# Patient Record
Sex: Female | Born: 1955
Health system: Southern US, Community
[De-identification: ages and names within clinical notes are randomized; demographics above are authoritative.]

## PROBLEM LIST (undated history)

## (undated) DIAGNOSIS — F32A Depression, unspecified: Secondary | ICD-10-CM

## (undated) DIAGNOSIS — Z789 Other specified health status: Secondary | ICD-10-CM

## (undated) HISTORY — PX: DILATION AND CURETTAGE OF UTERUS: SHX78

---

## 1998-12-02 ENCOUNTER — Other Ambulatory Visit: Admission: RE | Admit: 1998-12-02 | Discharge: 1998-12-02 | Payer: Self-pay | Admitting: Obstetrics and Gynecology

## 1999-01-17 ENCOUNTER — Other Ambulatory Visit: Admission: RE | Admit: 1999-01-17 | Discharge: 1999-01-17 | Payer: Self-pay | Admitting: Obstetrics and Gynecology

## 2001-03-12 ENCOUNTER — Other Ambulatory Visit: Admission: RE | Admit: 2001-03-12 | Discharge: 2001-03-12 | Payer: Self-pay | Admitting: Obstetrics and Gynecology

## 2002-05-06 ENCOUNTER — Other Ambulatory Visit: Admission: RE | Admit: 2002-05-06 | Discharge: 2002-05-06 | Payer: Self-pay | Admitting: Obstetrics and Gynecology

## 2003-05-25 ENCOUNTER — Other Ambulatory Visit: Admission: RE | Admit: 2003-05-25 | Discharge: 2003-05-25 | Payer: Self-pay | Admitting: Obstetrics and Gynecology

## 2004-09-28 ENCOUNTER — Other Ambulatory Visit: Admission: RE | Admit: 2004-09-28 | Discharge: 2004-09-28 | Payer: Self-pay | Admitting: Obstetrics and Gynecology

## 2005-12-07 ENCOUNTER — Other Ambulatory Visit: Admission: RE | Admit: 2005-12-07 | Discharge: 2005-12-07 | Payer: Self-pay | Admitting: Obstetrics and Gynecology

## 2007-05-27 ENCOUNTER — Encounter: Admission: RE | Admit: 2007-05-27 | Discharge: 2007-05-27 | Payer: Self-pay | Admitting: Obstetrics and Gynecology

## 2008-10-05 ENCOUNTER — Encounter: Admission: RE | Admit: 2008-10-05 | Discharge: 2008-10-05 | Payer: Self-pay | Admitting: Obstetrics and Gynecology

## 2009-10-19 ENCOUNTER — Encounter: Admission: RE | Admit: 2009-10-19 | Discharge: 2009-10-19 | Payer: Self-pay | Admitting: Obstetrics and Gynecology

## 2010-09-10 ENCOUNTER — Encounter: Payer: Self-pay | Admitting: Obstetrics and Gynecology

## 2010-11-14 ENCOUNTER — Other Ambulatory Visit: Payer: Self-pay | Admitting: Obstetrics and Gynecology

## 2010-11-14 DIAGNOSIS — Z1231 Encounter for screening mammogram for malignant neoplasm of breast: Secondary | ICD-10-CM

## 2010-11-22 ENCOUNTER — Ambulatory Visit: Payer: Self-pay

## 2010-11-22 ENCOUNTER — Ambulatory Visit
Admission: RE | Admit: 2010-11-22 | Discharge: 2010-11-22 | Disposition: A | Discharge status: Home / Self Care | Payer: 59 | Source: Ambulatory Visit | Attending: Obstetrics and Gynecology | Admitting: Obstetrics and Gynecology

## 2010-11-22 DIAGNOSIS — Z1231 Encounter for screening mammogram for malignant neoplasm of breast: Secondary | ICD-10-CM

## 2010-11-24 ENCOUNTER — Other Ambulatory Visit: Payer: Self-pay | Admitting: Obstetrics and Gynecology

## 2010-11-24 DIAGNOSIS — R928 Other abnormal and inconclusive findings on diagnostic imaging of breast: Secondary | ICD-10-CM

## 2010-11-29 ENCOUNTER — Ambulatory Visit
Admission: RE | Admit: 2010-11-29 | Discharge: 2010-11-29 | Disposition: A | Discharge status: Home / Self Care | Payer: 59 | Source: Ambulatory Visit | Attending: Obstetrics and Gynecology | Admitting: Obstetrics and Gynecology

## 2010-11-29 DIAGNOSIS — R928 Other abnormal and inconclusive findings on diagnostic imaging of breast: Secondary | ICD-10-CM

## 2011-05-15 ENCOUNTER — Encounter (HOSPITAL_COMMUNITY): Payer: Self-pay

## 2011-05-15 ENCOUNTER — Encounter (HOSPITAL_COMMUNITY)
Admission: RE | Admit: 2011-05-15 | Discharge: 2011-05-15 | Disposition: A | Discharge status: Home / Self Care | Payer: 59 | Source: Ambulatory Visit | Attending: Obstetrics and Gynecology | Admitting: Obstetrics and Gynecology

## 2011-05-15 HISTORY — DX: Other specified health status: Z78.9

## 2011-05-15 LAB — CBC
HCT: 39.1 % (ref 36.0–46.0)
Hemoglobin: 12.7 g/dL (ref 12.0–15.0)
MCH: 29.5 pg (ref 26.0–34.0)
MCV: 90.9 fL (ref 78.0–100.0)
RBC: 4.3 MIL/uL (ref 3.87–5.11)

## 2011-05-15 NOTE — Patient Instructions (Signed)
   Your procedure is scheduled on:05/22/11- Tuesday  Enter through the Main Entrance of [facility] at:6am Pick up the phone at the desk and dial [phone] and inform us of your arrival  Please call this number if you have any problems the morning of surgery: [phone]  Remember: Do not eat food after midnight  Do not drink clear liquids after:midnight Take these medicines the morning of surgery with a SIP OF WATER:none  Do not wear jewelry, make-up, or FINGER nail polish Do not wear lotions, powders, or perfumes.  You may wear deodorant. Do not shave 48 hours prior to surgery. Do not bring valuables to the hospital. Leave suitcase in the car. After Surgery it may be brought to your room. For patients being admitted to the hospital, checkout time is 11:00am the day of discharge.  Patients discharged on the day of surgery will not be allowed to drive home.   Name and phone number of your driver: [phone]- [phone]   Remember to use your hibiclens as instructed.Please shower with 1/2 bottle the evening before your surgery and the other 1/2 bottle the morning of surgery.

## 2011-05-18 NOTE — H&P (Signed)
NAME:  Jasmine Kelly, Jasmine Kelly NO.:  [id number]  MEDICAL RECORD NO.:  [id number]  LOCATION:                                 FACILITY:  PHYSICIAN:  Juluis Mire, M.D.   DATE OF BIRTH:  September 14, 1955  DATE OF ADMISSION: DATE OF DISCHARGE:                             HISTORY & PHYSICAL   The patient is a 55 year old, gravida 3, para 2, abortus 1, postmenopausal patient who presents for laparoscopic-assisted vaginal hysterectomy with bilateral salpingo-oophorectomy, anterior and posterior repair.  Mid urethral sling and possible sacrospinous ligament suspension.  In relation to present admission, the patient has had worsening pelvic relaxation in the form of moderate to severe cystocele and associated uterine descensus.  This has been associated with stress urinary incontinence.  She underwent urodynamic testing in the office, which did reveal stress incontinence due to urethral hypermobility.  She had normal leak point pressures.  She had normal urethral pressure profiles. There was no obstructive voiding pattern and she had no evidence of overactive bladder activity.  Her postvoid residual was also normal.  We had discussed with her options including use of pessaries or just conservative followup.  Presently, she wished to proceed with the surgery as noted above.  In terms of allergies, she has no known drug allergies.  MEDICATIONS:  Lexapro 20 mg a day, magnesium supplementation, and Omega vitamin.  PAST MEDICAL HISTORY:  She has had usual childhood diseases.  No significant sequelae.  PAST SURGICAL HISTORY:  She has had foot surgery and a previous D and C. She has also had two vaginal deliveries.  SOCIAL HISTORY:  No tobacco or alcohol use.  FAMILY HISTORY:  Noncontributory.  REVIEW OF SYSTEMS:  Noncontributory.  PHYSICAL EXAMINATION:  VITAL SIGNS:  The patient is afebrile, stable vital signs. HEENT:  The patient is normocephalic.  Pupils are equal, round,  and reactive to light and accommodation.  Extraocular movements are intact. Sclerae and conjunctivae are clear.  Oropharynx clear. NECK:  Without thyromegaly. BREASTS:  Not examined. LUNGS:  Clear. CARDIAC:  Regular rhythm and rate.  There are no murmurs or gallops. ABDOMEN:  Benign.  No mass, organomegaly, or tenderness. PELVIC:  Normal external genitalia.  Vaginal mucosa is somewhat atrophic.  She has descent of the cervix to the introitus associated moderate severe cystocele, mild rectocele.  Uterus normal size and shape.  Adnexa unremarkable. EXTREMITIES:  Trace edema. NEUROLOGIC:  Grossly normal within limits.  IMPRESSION:  Symptomatic pelvic relaxation with associated stress incontinence.  PLAN:  The patient will undergo the above-noted surgery.  Potential risk of recurrent pelvic relaxation has been discussed.  Risks of surgery have been explained including the risk of infection.  The risk of hemorrhage that could require transfusion with the risk of AIDS or hepatitis.  Risk of injury to adjacent organs including bladder, bowel, or ureters that could require further exploratory surgery.  Risk of deep venous thrombosis and pulmonary embolus.  There is the risk of injury to either the obturator nerve or possibly the pudendal nerve.  This can lead to chronic leg pain and weakness and possible buttocks pain and numbness or perineal pain and numbness.  With the mesh  system, there is the rare risk of mesh erosion.  This can require further surgical management.  Also, there is the risk of mesh rejection.  Overtightening the sling can lead to an obstructive voiding pattern requiring loosening the string with the possible return of incontinence.  There is the risk of increasing bladder contractility that can lead to leak and requiring medications for overactive bladder.  The patient expressed understanding potential risks, complications, and indications.     Juluis Mire,  M.D.     JSM/MEDQ  D:  05/18/2011  T:  05/18/2011  Job:  [id number]

## 2011-05-18 NOTE — H&P (Signed)
  Patient name Jasmine Kelly DICTATION# [id number] CSN#  [id number]  Juluis Mire, MD 05/18/2011 9:17 AM

## 2011-05-21 MED ORDER — CEFAZOLIN SODIUM-DEXTROSE 2-3 GM-% IV SOLR
2.0000 g | INTRAVENOUS | Status: AC
  Administered 2011-05-22: 2 g via INTRAVENOUS
  Filled 2011-05-21: qty 50

## 2011-05-22 ENCOUNTER — Encounter (HOSPITAL_COMMUNITY): Payer: Self-pay | Admitting: Anesthesiology

## 2011-05-22 ENCOUNTER — Encounter (HOSPITAL_COMMUNITY)
Admission: RE | Disposition: A | Discharge status: Home Health | Payer: Self-pay | Source: Ambulatory Visit | Attending: Obstetrics and Gynecology

## 2011-05-22 ENCOUNTER — Inpatient Hospital Stay (HOSPITAL_COMMUNITY): Payer: 59 | Admitting: Anesthesiology

## 2011-05-22 ENCOUNTER — Ambulatory Visit (HOSPITAL_COMMUNITY)
Admission: RE | Admit: 2011-05-22 | Discharge: 2011-05-23 | Disposition: A | Discharge status: Home Health | Payer: 59 | Source: Ambulatory Visit | Attending: Obstetrics and Gynecology | Admitting: Obstetrics and Gynecology

## 2011-05-22 ENCOUNTER — Other Ambulatory Visit: Payer: Self-pay | Admitting: Obstetrics and Gynecology

## 2011-05-22 ENCOUNTER — Encounter (HOSPITAL_COMMUNITY): Payer: Self-pay | Admitting: *Deleted

## 2011-05-22 DIAGNOSIS — N812 Incomplete uterovaginal prolapse: Secondary | ICD-10-CM | POA: Insufficient documentation

## 2011-05-22 DIAGNOSIS — N393 Stress incontinence (female) (male): Secondary | ICD-10-CM | POA: Insufficient documentation

## 2011-05-22 DIAGNOSIS — Z01812 Encounter for preprocedural laboratory examination: Secondary | ICD-10-CM | POA: Insufficient documentation

## 2011-05-22 DIAGNOSIS — N8189 Other female genital prolapse: Secondary | ICD-10-CM

## 2011-05-22 DIAGNOSIS — Z01818 Encounter for other preprocedural examination: Secondary | ICD-10-CM | POA: Insufficient documentation

## 2011-05-22 HISTORY — PX: LAPAROSCOPIC ASSISTED VAGINAL HYSTERECTOMY: SHX5398

## 2011-05-22 HISTORY — PX: BLADDER SUSPENSION: SHX72

## 2011-05-22 HISTORY — PX: ANTERIOR AND POSTERIOR REPAIR: SHX5121

## 2011-05-22 HISTORY — PX: CYSTOSCOPY: SHX5120

## 2011-05-22 SURGERY — HYSTERECTOMY, VAGINAL, LAPAROSCOPY-ASSISTED
Anesthesia: General | Site: Vagina | Wound class: Clean Contaminated

## 2011-05-22 MED ORDER — ROCURONIUM BROMIDE 50 MG/5ML IV SOLN
INTRAVENOUS | Status: AC
  Filled 2011-05-22: qty 1

## 2011-05-22 MED ORDER — ACETAMINOPHEN 325 MG PO TABS: 325.0000 mg | ORAL_TABLET | ORAL | Status: DC | PRN

## 2011-05-22 MED ORDER — FENTANYL CITRATE 0.05 MG/ML IJ SOLN
INTRAMUSCULAR | Status: AC
  Filled 2011-05-22: qty 5

## 2011-05-22 MED ORDER — MIDAZOLAM HCL 5 MG/5ML IJ SOLN
INTRAMUSCULAR | Status: DC | PRN
  Administered 2011-05-22: 2 mg via INTRAVENOUS

## 2011-05-22 MED ORDER — HYDROMORPHONE HCL 1 MG/ML IJ SOLN
INTRAMUSCULAR | Status: AC
  Filled 2011-05-22: qty 1

## 2011-05-22 MED ORDER — MIDAZOLAM HCL 2 MG/2ML IJ SOLN
INTRAMUSCULAR | Status: AC
  Filled 2011-05-22: qty 2

## 2011-05-22 MED ORDER — ROCURONIUM BROMIDE 100 MG/10ML IV SOLN
INTRAVENOUS | Status: DC | PRN
  Administered 2011-05-22: 40 mg via INTRAVENOUS

## 2011-05-22 MED ORDER — LACTATED RINGERS IV SOLN
INTRAVENOUS | Status: DC
  Administered 2011-05-22: 09:00:00 via INTRAVENOUS
  Administered 2011-05-22: 1000 mL via INTRAVENOUS
  Administered 2011-05-22: 07:00:00 via INTRAVENOUS

## 2011-05-22 MED ORDER — HYDROMORPHONE HCL 1 MG/ML IJ SOLN
0.2500 mg | INTRAMUSCULAR | Status: DC | PRN
  Administered 2011-05-22 (×2): 0.5 mg via INTRAVENOUS

## 2011-05-22 MED ORDER — INDIGOTINDISULFONATE SODIUM 8 MG/ML IJ SOLN
INTRAMUSCULAR | Status: DC | PRN
  Administered 2011-05-22: 5 mL via INTRAVENOUS

## 2011-05-22 MED ORDER — FENTANYL CITRATE 0.05 MG/ML IJ SOLN
INTRAMUSCULAR | Status: DC | PRN
  Administered 2011-05-22 (×2): 50 ug via INTRAVENOUS
  Administered 2011-05-22: 150 ug via INTRAVENOUS

## 2011-05-22 MED ORDER — HYDROMORPHONE 0.3 MG/ML IV SOLN
INTRAVENOUS | Status: DC
  Administered 2011-05-22: 1.39 mg via INTRAVENOUS
  Administered 2011-05-22: 12:00:00 via INTRAVENOUS
  Administered 2011-05-22: 1.2 mg via INTRAVENOUS
  Administered 2011-05-23: 0.999 mg via INTRAVENOUS
  Administered 2011-05-23: 0.4 mg via INTRAVENOUS

## 2011-05-22 MED ORDER — LIDOCAINE HCL (CARDIAC) 20 MG/ML IV SOLN
INTRAVENOUS | Status: AC
  Filled 2011-05-22: qty 5

## 2011-05-22 MED ORDER — ESCITALOPRAM OXALATE 20 MG PO TABS
20.0000 mg | ORAL_TABLET | Freq: Every day | ORAL | Status: DC
Start: 2011-05-22 — End: 2011-05-23
  Administered 2011-05-23: 20 mg via ORAL
  Filled 2011-05-22 (×2): qty 1

## 2011-05-22 MED ORDER — FENTANYL CITRATE 0.05 MG/ML IJ SOLN
INTRAMUSCULAR | Status: AC
  Administered 2011-05-22: 50 ug via INTRAVENOUS
  Filled 2011-05-22: qty 2

## 2011-05-22 MED ORDER — FENTANYL CITRATE 0.05 MG/ML IJ SOLN
25.0000 ug | INTRAMUSCULAR | Status: DC | PRN
  Administered 2011-05-22 (×2): 50 ug via INTRAVENOUS

## 2011-05-22 MED ORDER — SIMETHICONE 80 MG PO CHEW: 80.0000 mg | CHEWABLE_TABLET | Freq: Four times a day (QID) | ORAL | Status: DC | PRN

## 2011-05-22 MED ORDER — OXYCODONE-ACETAMINOPHEN 5-325 MG PO TABS: 1.0000 | ORAL_TABLET | ORAL | Status: DC | PRN

## 2011-05-22 MED ORDER — DEXAMETHASONE SODIUM PHOSPHATE 10 MG/ML IJ SOLN
INTRAMUSCULAR | Status: DC | PRN
  Administered 2011-05-22: 10 mg via INTRAVENOUS

## 2011-05-22 MED ORDER — PROPOFOL 10 MG/ML IV EMUL
INTRAVENOUS | Status: AC
  Filled 2011-05-22: qty 20

## 2011-05-22 MED ORDER — DOCUSATE SODIUM 100 MG PO CAPS
100.0000 mg | ORAL_CAPSULE | Freq: Every day | ORAL | Status: DC
  Administered 2011-05-23: 100 mg via ORAL
  Filled 2011-05-22: qty 1

## 2011-05-22 MED ORDER — ONDANSETRON HCL 4 MG/2ML IJ SOLN
INTRAMUSCULAR | Status: AC
  Filled 2011-05-22: qty 2

## 2011-05-22 MED ORDER — PROMETHAZINE HCL 25 MG/ML IJ SOLN: 6.2500 mg | INTRAMUSCULAR | Status: DC | PRN

## 2011-05-22 MED ORDER — LIDOCAINE-EPINEPHRINE (PF) 1 %-1:200000 IJ SOLN
INTRAMUSCULAR | Status: DC | PRN
  Administered 2011-05-22: 26 mL

## 2011-05-22 MED ORDER — DIPHENHYDRAMINE HCL 50 MG/ML IJ SOLN: 12.5000 mg | Freq: Four times a day (QID) | INTRAMUSCULAR | Status: DC | PRN

## 2011-05-22 MED ORDER — NEOSTIGMINE METHYLSULFATE 1 MG/ML IJ SOLN
INTRAMUSCULAR | Status: DC | PRN
  Administered 2011-05-22: 2 mg via INTRAVENOUS

## 2011-05-22 MED ORDER — SENNOSIDES-DOCUSATE SODIUM 8.6-50 MG PO TABS: 2.0000 | ORAL_TABLET | Freq: Every day | ORAL | Status: DC | PRN

## 2011-05-22 MED ORDER — CEFAZOLIN SODIUM-DEXTROSE 2-3 GM-% IV SOLR
2.0000 g | INTRAVENOUS | Status: DC
  Filled 2011-05-22: qty 50

## 2011-05-22 MED ORDER — NALOXONE HCL 0.4 MG/ML IJ SOLN: 0.4000 mg | INTRAMUSCULAR | Status: DC | PRN

## 2011-05-22 MED ORDER — ONDANSETRON HCL 4 MG/2ML IJ SOLN: 4.0000 mg | Freq: Four times a day (QID) | INTRAMUSCULAR | Status: DC | PRN

## 2011-05-22 MED ORDER — GLYCOPYRROLATE 0.2 MG/ML IJ SOLN
INTRAMUSCULAR | Status: AC
  Filled 2011-05-22: qty 2

## 2011-05-22 MED ORDER — LIDOCAINE HCL (CARDIAC) 20 MG/ML IV SOLN
INTRAVENOUS | Status: DC | PRN
  Administered 2011-05-22: 60 mg via INTRAVENOUS

## 2011-05-22 MED ORDER — ONDANSETRON HCL 4 MG PO TABS: 4.0000 mg | ORAL_TABLET | Freq: Four times a day (QID) | ORAL | Status: DC | PRN

## 2011-05-22 MED ORDER — KETOROLAC TROMETHAMINE 30 MG/ML IJ SOLN: 15.0000 mg | Freq: Once | INTRAMUSCULAR | Status: DC | PRN

## 2011-05-22 MED ORDER — DEXAMETHASONE SODIUM PHOSPHATE 10 MG/ML IJ SOLN
INTRAMUSCULAR | Status: AC
  Filled 2011-05-22: qty 1

## 2011-05-22 MED ORDER — ONDANSETRON HCL 4 MG/2ML IJ SOLN
INTRAMUSCULAR | Status: DC | PRN
  Administered 2011-05-22: 4 mg via INTRAVENOUS

## 2011-05-22 MED ORDER — INDIGOTINDISULFONATE SODIUM 8 MG/ML IJ SOLN
INTRAMUSCULAR | Status: AC
  Filled 2011-05-22: qty 5

## 2011-05-22 MED ORDER — GLYCOPYRROLATE 0.2 MG/ML IJ SOLN
INTRAMUSCULAR | Status: DC | PRN
  Administered 2011-05-22 (×2): 0.2 mg via INTRAVENOUS
  Administered 2011-05-22: .4 mg via INTRAVENOUS

## 2011-05-22 MED ORDER — HYDROMORPHONE 0.3 MG/ML IV SOLN
INTRAVENOUS | Status: AC
  Filled 2011-05-22: qty 25

## 2011-05-22 MED ORDER — MENTHOL 3 MG MT LOZG: 1.0000 | LOZENGE | OROMUCOSAL | Status: DC | PRN

## 2011-05-22 MED ORDER — BUPIVACAINE HCL (PF) 0.25 % IJ SOLN
INTRAMUSCULAR | Status: DC | PRN
  Administered 2011-05-22: 8 mL

## 2011-05-22 MED ORDER — STERILE WATER FOR IRRIGATION IR SOLN
Status: DC | PRN
  Administered 2011-05-22: 1000 mL via INTRAVESICAL

## 2011-05-22 MED ORDER — ZOLPIDEM TARTRATE 5 MG PO TABS: 5.0000 mg | ORAL_TABLET | Freq: Every evening | ORAL | Status: DC | PRN

## 2011-05-22 MED ORDER — PROPOFOL 10 MG/ML IV EMUL
INTRAVENOUS | Status: DC | PRN
  Administered 2011-05-22: 150 mg via INTRAVENOUS

## 2011-05-22 MED ORDER — HYDROMORPHONE HCL 1 MG/ML IJ SOLN
INTRAMUSCULAR | Status: DC | PRN
  Administered 2011-05-22: 1 mg via INTRAVENOUS

## 2011-05-22 MED ORDER — NEOSTIGMINE METHYLSULFATE 1 MG/ML IJ SOLN
INTRAMUSCULAR | Status: AC
  Filled 2011-05-22: qty 10

## 2011-05-22 MED ORDER — HYDROMORPHONE HCL 1 MG/ML IJ SOLN
INTRAMUSCULAR | Status: AC
  Administered 2011-05-22: 0.5 mg via INTRAVENOUS
  Filled 2011-05-22: qty 1

## 2011-05-22 MED ORDER — LACTATED RINGERS IR SOLN
Status: DC | PRN
  Administered 2011-05-22: 3000 mL

## 2011-05-22 MED ORDER — DIPHENHYDRAMINE HCL 12.5 MG/5ML PO ELIX: 12.5000 mg | ORAL_SOLUTION | Freq: Four times a day (QID) | ORAL | Status: DC | PRN

## 2011-05-22 MED ORDER — SODIUM CHLORIDE 0.9 % IJ SOLN: 9.0000 mL | INTRAMUSCULAR | Status: DC | PRN

## 2011-05-22 SURGICAL SUPPLY — 66 items
ADH SKN CLS APL DERMABOND .7 (GAUZE/BANDAGES/DRESSINGS) ×3
BANDAGE GAUZE ELAST BULKY 4 IN (GAUZE/BANDAGES/DRESSINGS) IMPLANT
BLADE SURG 11 STRL SS (BLADE) ×4 IMPLANT
BLADE SURG 15 STRL LF C SS BP (BLADE) ×3 IMPLANT
BLADE SURG 15 STRL SS (BLADE) ×4
CABLE HIGH FREQUENCY MONO STRZ (ELECTRODE) IMPLANT
CANISTER SUCTION 2500CC (MISCELLANEOUS) ×4 IMPLANT
CATH BONANNO SUPRAPUBIC 14G (CATHETERS) IMPLANT
CATH ROBINSON RED A/P 16FR (CATHETERS) ×4 IMPLANT
CLOTH BEACON ORANGE TIMEOUT ST (SAFETY) ×4 IMPLANT
CONT PATH 16OZ SNAP LID 3702 (MISCELLANEOUS) ×4 IMPLANT
COVER TABLE BACK 60X90 (DRAPES) ×4 IMPLANT
DECANTER SPIKE VIAL GLASS SM (MISCELLANEOUS) ×1 IMPLANT
DERMABOND ADVANCED (GAUZE/BANDAGES/DRESSINGS) ×1
DERMABOND ADVANCED .7 DNX12 (GAUZE/BANDAGES/DRESSINGS) ×3 IMPLANT
DEVICE CAPIO SUTURING (INSTRUMENTS) ×1
DEVICE CAPIO SUTURING OPC (INSTRUMENTS) IMPLANT
DRAPE CAMERA CLOSED 9X96 (DRAPES) ×4 IMPLANT
DRAPE HYSTEROSCOPY (DRAPE) ×3 IMPLANT
DRAPE UTILITY XL STRL (DRAPES) ×3 IMPLANT
ELECT REM PT RETURN 9FT ADLT (ELECTROSURGICAL) ×4
ELECTRODE REM PT RTRN 9FT ADLT (ELECTROSURGICAL) IMPLANT
GAUZE KERLIX 2  STERILE LF (GAUZE/BANDAGES/DRESSINGS) ×1 IMPLANT
GLOVE BIO SURGEON STRL SZ7 (GLOVE) ×12 IMPLANT
GLOVE BIOGEL PI IND STRL 6.5 (GLOVE) ×3 IMPLANT
GLOVE BIOGEL PI INDICATOR 6.5 (GLOVE) ×1
GOWN PREVENTION PLUS LG XLONG (DISPOSABLE) ×12 IMPLANT
GOWN STRL REIN XL XLG (GOWN DISPOSABLE) ×4 IMPLANT
NDL INSUFFLATION 14GA 120MM (NEEDLE) IMPLANT
NEEDLE HYPO 22GX1.5 SAFETY (NEEDLE) ×1 IMPLANT
NEEDLE INSUFFLATION 14GA 120MM (NEEDLE) ×4 IMPLANT
NEEDLE MAYO .5 CIRCLE (NEEDLE) ×1 IMPLANT
NS IRRIG 1000ML POUR BTL (IV SOLUTION) ×4 IMPLANT
PACK LAVH (CUSTOM PROCEDURE TRAY) ×4 IMPLANT
PACK VAGINAL WOMENS (CUSTOM PROCEDURE TRAY) ×3 IMPLANT
SEALER TISSUE G2 CVD JAW 45CM (ENDOMECHANICALS) ×4 IMPLANT
SET CYSTO W/LG BORE CLAMP LF (SET/KITS/TRAYS/PACK) ×4 IMPLANT
SET IRRIG TUBING LAPAROSCOPIC (IRRIGATION / IRRIGATOR) ×1 IMPLANT
SLING HALO OBTRYX (Sling) ×1 IMPLANT
STRIP CLOSURE SKIN 1/4X3 (GAUZE/BANDAGES/DRESSINGS) IMPLANT
SUT CAPIO POLYGLYCOLIC (SUTURE) ×1 IMPLANT
SUT CHROMIC 2 0 UR 5 27 (SUTURE) IMPLANT
SUT MON AB 2-0 CT1 27 (SUTURE) ×4 IMPLANT
SUT MON AB 3-0 SH 27 (SUTURE)
SUT MON AB 3-0 SH27 (SUTURE) IMPLANT
SUT VIC AB 0 CT1 18XCR BRD8 (SUTURE) ×9 IMPLANT
SUT VIC AB 0 CT1 27 (SUTURE)
SUT VIC AB 0 CT1 27XBRD ANBCTR (SUTURE) IMPLANT
SUT VIC AB 0 CT1 36 (SUTURE) ×7 IMPLANT
SUT VIC AB 0 CT1 8-18 (SUTURE) ×12
SUT VIC AB 2-0 CT1 27 (SUTURE) ×24
SUT VIC AB 2-0 CT1 TAPERPNT 27 (SUTURE) ×24 IMPLANT
SUT VIC AB 2-0 CT2 27 (SUTURE) IMPLANT
SUT VIC AB 2-0 SH 27 (SUTURE)
SUT VIC AB 2-0 SH 27XBRD (SUTURE) IMPLANT
SUT VIC AB 2-0 UR6 27 (SUTURE) ×8 IMPLANT
SUT VICRYL 0 UR6 27IN ABS (SUTURE) ×7 IMPLANT
SUT VICRYL 1 TIES 12X18 (SUTURE) ×4 IMPLANT
SUT VICRYL 4-0 PS2 18IN ABS (SUTURE) ×7 IMPLANT
TOWEL OR 17X24 6PK STRL BLUE (TOWEL DISPOSABLE) ×8 IMPLANT
TRAY FOLEY CATH 14FR (SET/KITS/TRAYS/PACK) ×4 IMPLANT
TROCAR BALLN 12MMX100 BLUNT (TROCAR) ×3 IMPLANT
TROCAR XCEL NON-BLD 5MMX100MML (ENDOMECHANICALS) ×1 IMPLANT
TROCAR Z-THREAD BLADED 11X100M (TROCAR) ×1 IMPLANT
WARMER LAPAROSCOPE (MISCELLANEOUS) ×4 IMPLANT
WATER STERILE IRR 1000ML POUR (IV SOLUTION) ×3 IMPLANT

## 2011-05-22 NOTE — Anesthesia Postprocedure Evaluation (Signed)
  Anesthesia Post-op Note  Patient: Jasmine Kelly  Procedure(s) Performed:  LAPAROSCOPIC ASSISTED VAGINAL HYSTERECTOMY - bilateral salpingo-oophorectomy; ANTERIOR (CYSTOCELE) AND POSTERIOR REPAIR (RECTOCELE) - Sacrospinous ligament suspension; TRANSVAGINAL TAPE (TVT) PROCEDURE - Mid urethral sling; CYSTOSCOPY  Patient Location: [facility]  Anesthesia Type: General  Level of Consciousness: awake, alert , oriented and patient cooperative  Airway and Oxygen Therapy: Patient Spontanous Breathing  Post-op Pain: none  Post-op Assessment: Post-op Vital signs reviewed, Patient's Cardiovascular Status Stable, Respiratory Function Stable, Patent Airway, No signs of Nausea or vomiting, Adequate PO intake and Pain level controlled  Post-op Vital Signs: Reviewed and stable  Complications: No apparent anesthesia complications

## 2011-05-22 NOTE — Transfer of Care (Signed)
Immediate Anesthesia Transfer of Care Note  Patient: Jasmine Kelly  Procedure(s) Performed:  LAPAROSCOPIC ASSISTED VAGINAL HYSTERECTOMY - bilateral salpingo-oophorectomy; ANTERIOR (CYSTOCELE) AND POSTERIOR REPAIR (RECTOCELE) - Sacrospinous ligament suspension; TRANSVAGINAL TAPE (TVT) PROCEDURE - Mid urethral sling; CYSTOSCOPY  Patient Location: PACU  Anesthesia Type: General  Level of Consciousness: alert  and sedated  Airway & Oxygen Therapy: Patient connected to nasal cannula oxygen  Post-op Assessment: Report given to PACU RN and Post -op Vital signs reviewed and stable  Post vital signs: stable  Complications: No apparent anesthesia complications

## 2011-05-22 NOTE — Brief Op Note (Signed)
05/22/2011  9:38 AM  PATIENT:  Shepard General  55 y.o. female  PRE-OPERATIVE DIAGNOSIS:  pelvic relaxation, stress urinary incontinence  POST-OPERATIVE DIAGNOSIS:  pelvic relaxation, stress urinary incontinence  PROCEDURE:  Procedure(s): LAPAROSCOPIC ASSISTED VAGINAL HYSTERECTOMY ANTERIOR (CYSTOCELE) AND POSTERIOR REPAIR (RECTOCELE) TRANSVAGINAL TAPE (TVT) PROCEDURE CYSTOSCOPY  SURGEON:  Surgeon(s): Amelianna Meller S Tristyn Pharris Roselle Locus II  PHYSICIAN ASSISTANT:   ASSISTANTS: tomblin   ANESTHESIA:   general  OR FLUID I/O:  Total I/O In: 2300 [I.V.:2300] Out: 450 [Urine:200; Blood:250]  BLOOD ADMINISTERED:none  DRAINS: Urinary Catheter (Foley)   LOCAL MEDICATIONS USED:  XYLOCAINE20*CC  SPECIMEN:  Source of Specimen:  uterus tubes and ovaries  DISPOSITION OF SPECIMEN:  PATHOLOGY  COUNTS:  YES  TOURNIQUET:  * No tourniquets in log *  DICTATION: .Other Dictation: Dictation Number [id number]  PLAN OF CARE: Admit for overnight observation  PATIENT DISPOSITION:  PACU - hemodynamically stable.   Delay start of Pharmacological VTE agent (>24hrs) due to surgical blood loss or risk of bleeding:  no

## 2011-05-22 NOTE — Progress Notes (Signed)
Encounter addended by: Suella Grove on: 05/22/2011  5:37 PM<BR>     Documentation filed: Notes Section

## 2011-05-22 NOTE — H&P (Signed)
Hand P unchanged

## 2011-05-22 NOTE — Op Note (Signed)
Jasmine Kelly, Jasmine Kelly NO.:  [id number]  MEDICAL RECORD NO.:  [id number]  LOCATION:  9315                          FACILITY:  [facility]  PHYSICIAN:  Juluis Mire, M.D.   DATE OF BIRTH:  May 24, 1956  DATE OF PROCEDURE:  05/22/2011 DATE OF DISCHARGE:                              OPERATIVE REPORT   PREOPERATIVE DIAGNOSIS:  Pelvic relaxation with associated stress urinary incontinence.  POSTOPERATIVE DIAGNOSIS:  Pelvic relaxation with associated stress urinary incontinence.  OPERATIVE PROCEDURES:  Laparoscopic-assisted vaginal hysterectomy with bilateral salpingo-oophorectomy.  Anterior colporrhaphy.  Midurethral sling using a transobturator approach.  Cystoscopy.  Posterior colporrhaphy.  Sacrospinous ligament suspension.  SURGEON:  Juluis Mire, MD  ASSISTANT:  Guy Sandifer. Tomblin, MD  ANESTHESIA:  General endotracheal.  ESTIMATED BLOOD LOSS:  3-400 mL.  PACKS:  Vaginal pack.  DRAINS:  Urethral Foley.  INTRAOPERATIVE BLOOD REPLACEMENT:  None.  COMPLICATIONS:  None.  INDICATIONS:  As dictated in the history and physical.  PROCEDURE:  The patient was taken to OR and placed supine position. After satisfactory level of general endotracheal anesthesia was obtained, the patient was placed in dorsal lithotomy position using Allen stirrups.  The abdomen, perineum, and vagina were prepped out with Betadine.  Hulka tenaculum was put in place.  Bladder was emptied via in- and-out catheterization.  The patient was then draped sterile field. Subumbilical incision was made with knife.  A Veress needle was introduced into the abdominal cavity.  The abdomen was insufflated with approximately 3 liters of carbon dioxide.  The operating laparoscope was introduced.  Visualization revealed no evidence of injury to adjacent organs.  The upper abdomen including liver tip of the gallbladder was clear.  Both lateral gutters including appendix were normal.  Uterus, tubes, and  ovaries were unremarkable.  A 5-mm trocar was put in place in the suprapubic area under direct visualization.  The right ovary was elevated.  We could easily see the ureter coursing along the right pelvic sidewall using the Enseal.  The right ovarian pedicle was cauterized and incised.  The mesenteric attachments of tubes and ovaries were cauterized and incised up to the round ligament.  The round ligament was then cauterized and incised.  We then went to the left side.  Left ovary was elevated.  Again, the ureter was easily identified along the left pelvic sidewall.  Using Enseal, the left ovarian vasculature was cauterized and incised.  Mesenteric attachments of tube and ovary were cauterized and incised and the left round ligament was cauterized and incised.  At this point in time, we had good freeing up to the adnexa.  Laparoscope was then removed.  The abdomen was deflated with carbon dioxide.  Legs were repositioned.  The Hulka tenaculum was then removed. Weighted speculum was placed in the vaginal vault.  Cervix was grasped with Christella Hartigan tenaculum.  Cul-de-sac was entered sharply.  Both uterosacral ligaments were clamped, cut, and suture ligated with 0 Vicryl.  Reflection of the vaginal mucosa anteriorly was incised and bladder was dissected superiorly.  Paracervical tissue was clamped, cut, and suture ligated with 0 Vicryl.  The vesicouterine space was identified, entered sharply and retractors put in place.  Using the clamp, cut, and tie technique with suture ligatures of 0 Vicryl, the parametrium was serially separated and incised the uterus.  Uterus was then flipped.  Remaining pedicles were clamped and cut.  Uterus, tubes, and ovaries were passed off the operative field and sent to Pathology, held pedicles and secured with free tie of 0 Vicryl.  Posterior vaginal cuff was run with a running locking sutures of 2-0 Vicryl.  Uterosacral plication stitch of 0 Vicryl was put in  place and secured.  Posterior aspect of vaginal cuff was closed with interrupted figure-of-eights of 2- 0 Vicryl.  At this point in time, we went to the cystocele.  We grasped the edge of the vaginal cuff anteriorly.  We were able to dissect anteriorly under the vaginal mucosa.  We then incised the vaginal mucosa in the midline. We dissected off the pubocervical fascia from the overlying vaginal mucosa.  She really did not have much of the cystocele.  It looked like most of her problems from apical support.  However, we did reapproximate the pubocervical fascia in the midline with interrupted sutures of 2-0 Vicryl obliterating the cystocele.  Vaginal mucosa was then trimmed and the vaginal mucosa was closed with interrupted sutures of 2-0 Vicryl. We also closed the remaining part of the anterior part of the vaginal cuff.  Next, the suburethral area was identified.  A Foley was placed into the urethra and clamped off.  Midurethral area was identified and infiltrated with 1% Xylocaine with epinephrine.  We also infiltrated out laterally on each side.  Vaginal mucosa was then incised vertically.  We dissected out laterally on both sides.  Using sharp and blunt dissection, we were able to dissect out to the obturator foramen.  Two areas on the groin were identified below the abductus longus muscle at the level of the clitoris and lateral to the inferior pubic ramus.  The ovaries were infiltrated with 1% Xylocaine and an incision was made in the skin.  Using OptiRex halo system, both needles were passed through the skin through the obturator foramen around the inferior pubic ramus and out to the vaginal mucosa on each side.  There was no evidence that we had buttonholed the vaginal mucosa.  At this point in time, the Foley was removed.  Cystoscopy was performed.  There was no evidence of injury to the bladder or urethra.  The patient had been given indigo carmine. Jets of blue-tinged urine  was noted to be coming from both ureteral orifices.  Cystoscope was then removed.  The bladder was drained.  The polypropylene mesh was brought in place, secured to each needle and brought out through the skin incisions.  We adjusted the mesh under the urethra to lie flat, but we could rotate a Kelly 90 degrees.  The plastic tip was excised.  The plastic sheaths were removed.  Again, adjusting it under the urethra, we could easily get a Tresa Endo between it and rotated it, but it did lie flat.  The arms of the mesh were trimmed at the level of the skin and the skin was closed with Dermabond.  The vaginal mucosa was closed with a running locking suture of 2-0 Vicryl.  At this point in time, we returned to the posterior repair.  The skin incision was made in the skin of the perineal body and excised up to the vaginal opening.  This was excised.  We infiltrated the vaginal mucosa with 1% Xylocaine with epinephrine.  We then began dissecting  the vaginal mucosa from the underlying perirectal fascia.  This was continued up to the vaginal cuff using both blunt and sharp dissection. We then dissected out to the right side to the sacrospinous ligament. Using the Capio with an absorbable suture of 0 Vicryl, we secured the Vicryl to the sacrospinous ligament near the sacrum.  This was held initially.  At this point in time, we got a free needle and secured the sacrospinous ligament, sutured to the top of the vaginal cuff to the right.  This was held.  We then brought together the perirectal fascia in the midline with interrupted sutures of 2-0 Vicryl completely obliterating the rectocele.  We then tied down the sacrospinous ligament suspension with good elevation of the vaginal cuff.  The vaginal mucosa was then closed with interrupted sutures of 2-0 Vicryl until we got to the vaginal introitus.  Perineum was rebuilt with 2-0 Vicryl.  Skin was closed with running subcuticular of 2-0 Vicryl.  We had  good reapproximation.  Rectal exam was unremarkable.  At this point in time, the abdomen was reinflated with carbon dioxide. Laparoscope was reintroduced.  Visualization revealed actually no active bleeding.  We went to the right ovarian pedicle.  There was a slight bit of oozing and brought under control with bipolar.  Otherwise, we had excellent hemostasis.  We irrigated the pelvis.  No active bleeding was noted.  No injury to the adjacent organs.  The abdomen was deflated with carbon dioxide.  All trocars were removed.  Subumbilical incision was closed with interrupted subcuticular of 4-0 Vicryl.  The suprapubic incision was closed with Dermabond.  A vaginal pack was placed into the vagina.  Foley was straight drained with retrieval of blue-tinged urine.  The patient was taken out of dorsal lithotomy position once alert and extubated, transferred recovery in good condition.  Sponge, instrument, and needle counts were reported as correct by circulating nurse x2.     Juluis Mire, M.D.     JSM/MEDQ  D:  05/22/2011  T:  05/22/2011  Job:  [id number]

## 2011-05-22 NOTE — Anesthesia Preprocedure Evaluation (Signed)
Anesthesia Evaluation  Name, MR# and DOB Patient awake  General Assessment Comment  Reviewed: Allergy & Precautions, H&P , Patient's Chart, lab work & pertinent test results  Airway Mallampati: II TM Distance: >3 FB Neck ROM: full    Dental No notable dental hx.    Pulmonary  clear to auscultation  Pulmonary exam normal       Cardiovascular regular Normal    Neuro/Psych PSYCHIATRIC DISORDERS Negative Neurological ROS  Negative Psych ROS   GI/Hepatic negative GI ROS Neg liver ROS    Endo/Other  Negative Endocrine ROS  Renal/GU negative Renal ROS     Musculoskeletal   Abdominal   Peds  Hematology negative hematology ROS (+)   Anesthesia Other Findings   Reproductive/Obstetrics (+) Pregnancy                           Anesthesia Physical Anesthesia Plan  ASA: II  Anesthesia Plan: General   Post-op Pain Management:    Induction: Intravenous  Airway Management Planned: Oral ETT  Additional Equipment:   Intra-op Plan:   Post-operative Plan:   Informed Consent: I have reviewed the patients History and Physical, chart, labs and discussed the procedure including the risks, benefits and alternatives for the proposed anesthesia with the patient or authorized representative who has indicated his/her understanding and acceptance.     Plan Discussed with:   Anesthesia Plan Comments:         Anesthesia Quick Evaluation

## 2011-05-22 NOTE — Progress Notes (Signed)
  Patient name  Jasmine Kelly DICTATION# [id number] CSN#  [id number]  Juluis Mire, MD 05/22/2011 9:40 AM

## 2011-05-22 NOTE — Anesthesia Postprocedure Evaluation (Signed)
  Anesthesia Post-op Note  Patient: Jasmine Kelly  Procedure(s) Performed:  LAPAROSCOPIC ASSISTED VAGINAL HYSTERECTOMY - bilateral salpingo-oophorectomy; ANTERIOR (CYSTOCELE) AND POSTERIOR REPAIR (RECTOCELE) - Sacrospinous ligament suspension; TRANSVAGINAL TAPE (TVT) PROCEDURE - Mid urethral sling; CYSTOSCOPY  Patient Location: PACU  Anesthesia Type: General  Level of Consciousness: awake, alert  and oriented  Airway and Oxygen Therapy: Patient Spontanous Breathing  Post-op Pain: none  Post-op Assessment: Post-op Vital signs reviewed, Patient's Cardiovascular Status Stable, Respiratory Function Stable, Patent Airway, No signs of Nausea or vomiting and Pain level controlled  Post-op Vital Signs: Reviewed and stable  Complications: No apparent anesthesia complications

## 2011-05-23 ENCOUNTER — Encounter (HOSPITAL_COMMUNITY): Payer: Self-pay | Admitting: Obstetrics and Gynecology

## 2011-05-23 LAB — CBC
HCT: 33.2 % — ABNORMAL LOW (ref 36.0–46.0)
Hemoglobin: 10.8 g/dL — ABNORMAL LOW (ref 12.0–15.0)
MCV: 91.5 fL (ref 78.0–100.0)
RBC: 3.63 MIL/uL — ABNORMAL LOW (ref 3.87–5.11)
WBC: 10.9 10*3/uL — ABNORMAL HIGH (ref 4.0–10.5)

## 2011-05-23 MED ORDER — OXYCODONE-ACETAMINOPHEN 5-500 MG PO CAPS: 1.0000 | ORAL_CAPSULE | ORAL | Status: AC | PRN

## 2011-05-23 NOTE — Discharge Summary (Signed)
Admitting diagnosis: Pelvic relaxation with associated stress incontinence.   Discharge diagnosis: Same  Procedure: laproscopically assisted vaginal hysterectomy bilateral salpingo-oophorectomy. Anterior and posterior repair. Sacrospinous ligament suspension. Mid urethral sling using trans-operator approach. Cystoscopy.  Admission history and physical: Lisi dictated history and physical.  Course in the hospital: The patient was brought in and underwent the above-noted surgery. Postoperatively she did well. Postop hemoglobin was 10.8. On the first morning after surgery the pack was removed. She had minimal vaginal bleeding. Her abdomen was soft. Bowel sounds were active. All incisions were clear. He has been tolerating oral intake well. He is undergoing a voiding trial at this point in time. Once complete she'll be discharged home.  Complications: None  Discharge status: Stable  Disposition: The patient be discharged home with the following instructions. She is to avoid heavy lifting. She is to avoid vaginal entrance. She's also he should avoid driving until she can drive safely. Instructed to call should there be any signs of infection in terms of fever. Also instructed to call should there be increasing nausea and vomiting. Should report any heavy vaginal bleeding. She report any excessive pain. Is also instructed in signs and symptoms of deep venous thrombosis and pulmonary embolus. She discharged home intolerances she needs it for pain. The office will call her tomorrow to arrange followup. She does not passed a voiding trial should be sent home with a catheter in place and followup in the office in the morning.

## 2011-05-23 NOTE — Progress Notes (Signed)
1 Day Post-Op Procedure(s): LAPAROSCOPIC ASSISTED VAGINAL HYSTERECTOMY ANTERIOR (CYSTOCELE) AND POSTERIOR REPAIR (RECTOCELE) TRANSVAGINAL TAPE (TVT) PROCEDURE CYSTOSCOPY  Subjective: Patient reports tolerating PO.    Objective: I have reviewed patient's vital signs.  abdomine soft post bowel sound incisions clear pack removed no active bleeding Has not voided yet  Assessment: s/p Procedure(s): LAPAROSCOPIC ASSISTED VAGINAL HYSTERECTOMY ANTERIOR (CYSTOCELE) AND POSTERIOR REPAIR (RECTOCELE) TRANSVAGINAL TAPE (TVT) PROCEDURE CYSTOSCOPY: stable  Plan: Discharge home  LOS: 1 day    Annahi Short S 05/23/2011, 8:57 AM

## 2011-10-24 ENCOUNTER — Other Ambulatory Visit: Payer: Self-pay | Admitting: Obstetrics and Gynecology

## 2011-10-24 DIAGNOSIS — Z1231 Encounter for screening mammogram for malignant neoplasm of breast: Secondary | ICD-10-CM

## 2011-11-30 ENCOUNTER — Ambulatory Visit
Admission: RE | Admit: 2011-11-30 | Discharge: 2011-11-30 | Disposition: A | Discharge status: Home / Self Care | Payer: 59 | Source: Ambulatory Visit | Attending: Obstetrics and Gynecology | Admitting: Obstetrics and Gynecology

## 2011-11-30 DIAGNOSIS — Z1231 Encounter for screening mammogram for malignant neoplasm of breast: Secondary | ICD-10-CM

## 2012-10-30 ENCOUNTER — Other Ambulatory Visit: Payer: Self-pay

## 2012-10-30 DIAGNOSIS — Z1231 Encounter for screening mammogram for malignant neoplasm of breast: Secondary | ICD-10-CM

## 2012-12-01 ENCOUNTER — Other Ambulatory Visit: Payer: Self-pay | Admitting: Obstetrics and Gynecology

## 2012-12-01 ENCOUNTER — Ambulatory Visit
Admission: RE | Admit: 2012-12-01 | Discharge: 2012-12-01 | Disposition: A | Discharge status: Home / Self Care | Payer: 59 | Source: Ambulatory Visit

## 2012-12-01 DIAGNOSIS — Z1231 Encounter for screening mammogram for malignant neoplasm of breast: Secondary | ICD-10-CM

## 2012-12-01 DIAGNOSIS — R928 Other abnormal and inconclusive findings on diagnostic imaging of breast: Secondary | ICD-10-CM

## 2013-01-01 ENCOUNTER — Ambulatory Visit
Admission: RE | Admit: 2013-01-01 | Discharge: 2013-01-01 | Disposition: A | Discharge status: Home / Self Care | Payer: 59 | Source: Ambulatory Visit | Attending: Obstetrics and Gynecology | Admitting: Obstetrics and Gynecology

## 2013-01-01 DIAGNOSIS — R928 Other abnormal and inconclusive findings on diagnostic imaging of breast: Secondary | ICD-10-CM

## 2013-06-26 ENCOUNTER — Ambulatory Visit (HOSPITAL_BASED_OUTPATIENT_CLINIC_OR_DEPARTMENT_OTHER)
Admission: RE | Admit: 2013-06-26 | Discharge: 2013-06-26 | Disposition: A | Discharge status: Home / Self Care | Payer: Worker's Compensation | Source: Ambulatory Visit | Attending: Orthopedic Surgery | Admitting: Orthopedic Surgery

## 2013-06-26 ENCOUNTER — Other Ambulatory Visit: Payer: Self-pay | Admitting: Orthopedic Surgery

## 2013-06-26 ENCOUNTER — Encounter (HOSPITAL_BASED_OUTPATIENT_CLINIC_OR_DEPARTMENT_OTHER): Payer: Self-pay | Admitting: *Deleted

## 2013-06-26 ENCOUNTER — Encounter (HOSPITAL_BASED_OUTPATIENT_CLINIC_OR_DEPARTMENT_OTHER)
Admission: RE | Disposition: A | Discharge status: Home / Self Care | Payer: Self-pay | Source: Ambulatory Visit | Attending: Orthopedic Surgery

## 2013-06-26 DIAGNOSIS — S61209A Unspecified open wound of unspecified finger without damage to nail, initial encounter: Secondary | ICD-10-CM | POA: Insufficient documentation

## 2013-06-26 DIAGNOSIS — W278XXA Contact with other nonpowered hand tool, initial encounter: Secondary | ICD-10-CM | POA: Insufficient documentation

## 2013-06-26 DIAGNOSIS — Z1811 Retained magnetic metal fragments: Secondary | ICD-10-CM | POA: Insufficient documentation

## 2013-06-26 HISTORY — PX: MASS EXCISION: SHX2000

## 2013-06-26 SURGERY — MINOR EXCISION OF MASS
Anesthesia: LOCAL | Site: Thumb | Laterality: Left | Wound class: Dirty or Infected

## 2013-06-26 MED ORDER — LIDOCAINE HCL 1 % IJ SOLN
INTRAMUSCULAR | Status: DC | PRN
  Administered 2013-06-26: 13:00:00

## 2013-06-26 MED ORDER — BUPIVACAINE HCL (PF) 0.25 % IJ SOLN
INTRAMUSCULAR | Status: AC
  Filled 2013-06-26: qty 30

## 2013-06-26 MED ORDER — LIDOCAINE HCL (PF) 1 % IJ SOLN
INTRAMUSCULAR | Status: AC
  Filled 2013-06-26: qty 30

## 2013-06-26 SURGICAL SUPPLY — 40 items
BANDAGE COBAN STERILE 2 (GAUZE/BANDAGES/DRESSINGS) IMPLANT
BANDAGE CONFORM 2  STR LF (GAUZE/BANDAGES/DRESSINGS) IMPLANT
BLADE MINI RND TIP GREEN BEAV (BLADE) IMPLANT
BLADE SURG 15 STRL LF DISP TIS (BLADE) ×1 IMPLANT
BLADE SURG 15 STRL SS (BLADE) ×2
BNDG CMPR 9X4 STRL LF SNTH (GAUZE/BANDAGES/DRESSINGS)
BNDG CMPR MD 5X2 ELC HKLP STRL (GAUZE/BANDAGES/DRESSINGS)
BNDG COHESIVE 1X5 TAN STRL LF (GAUZE/BANDAGES/DRESSINGS) ×1 IMPLANT
BNDG ELASTIC 2 VLCR STRL LF (GAUZE/BANDAGES/DRESSINGS) IMPLANT
BNDG ESMARK 4X9 LF (GAUZE/BANDAGES/DRESSINGS) IMPLANT
CHLORAPREP W/TINT 26ML (MISCELLANEOUS) ×2 IMPLANT
CORDS BIPOLAR (ELECTRODE) IMPLANT
COVER MAYO STAND STRL (DRAPES) ×2 IMPLANT
COVER TABLE BACK 60X90 (DRAPES) IMPLANT
CUFF TOURNIQUET SINGLE 18IN (TOURNIQUET CUFF) ×1 IMPLANT
DRAIN PENROSE 1/2X12 LTX STRL (WOUND CARE) IMPLANT
DRAIN PENROSE 1/4X12 LTX STRL (WOUND CARE) ×1 IMPLANT
DRAPE EXTREMITY T 121X128X90 (DRAPE) IMPLANT
DRAPE SURG 17X23 STRL (DRAPES) ×1 IMPLANT
GAUZE XEROFORM 1X8 LF (GAUZE/BANDAGES/DRESSINGS) ×2 IMPLANT
GLOVE BIO SURGEON STRL SZ7.5 (GLOVE) ×2 IMPLANT
GLOVE SURG SS PI 7.0 STRL IVOR (GLOVE) ×1 IMPLANT
GOWN BRE IMP PREV XXLGXLNG (GOWN DISPOSABLE) IMPLANT
GOWN PREVENTION PLUS XLARGE (GOWN DISPOSABLE) IMPLANT
LOOP VESSEL MAXI BLUE (MISCELLANEOUS) ×1 IMPLANT
NDL HYPO 25X1 1.5 SAFETY (NEEDLE) ×1 IMPLANT
NEEDLE HYPO 25X1 1.5 SAFETY (NEEDLE) ×2 IMPLANT
NS IRRIG 1000ML POUR BTL (IV SOLUTION) ×2 IMPLANT
PACK BASIN DAY SURGERY FS (CUSTOM PROCEDURE TRAY) ×1 IMPLANT
PADDING CAST ABS 4INX4YD NS (CAST SUPPLIES)
PADDING CAST ABS COTTON 4X4 ST (CAST SUPPLIES) ×1 IMPLANT
SPONGE GAUZE 4X4 12PLY (GAUZE/BANDAGES/DRESSINGS) ×2 IMPLANT
STOCKINETTE 4X48 STRL (DRAPES) ×2 IMPLANT
SUT ETHILON 4 0 PS 2 18 (SUTURE) IMPLANT
SUT ETHILON 5 0 P 3 18 (SUTURE) ×1
SUT NYLON ETHILON 5-0 P-3 1X18 (SUTURE) IMPLANT
SYR BULB 3OZ (MISCELLANEOUS) ×1 IMPLANT
SYR CONTROL 10ML LL (SYRINGE) ×2 IMPLANT
TOWEL OR 17X24 6PK STRL BLUE (TOWEL DISPOSABLE) ×4 IMPLANT
UNDERPAD 30X30 INCONTINENT (UNDERPADS AND DIAPERS) ×2 IMPLANT

## 2013-06-26 NOTE — H&P (Signed)
  Jasmine Kelly is an 57 y.o. female.   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  Seen at [facility] where part of needle was removed.  Referred for further care.  Reports no previous injury to thumb and no other injury at this time.  Past Medical History  Diagnosis Date  . No pertinent past medical history     Past Surgical History  Procedure Laterality Date  . Dilation and curettage of uterus    . Laparoscopic assisted vaginal hysterectomy  05/22/2011    Procedure: LAPAROSCOPIC ASSISTED VAGINAL HYSTERECTOMY;  Surgeon: Juluis Mire;  Location: [facility];  Service: Gynecology;  Laterality: Bilateral;  bilateral salpingo-oophorectomy  . Anterior and posterior repair  05/22/2011    Procedure: ANTERIOR (CYSTOCELE) AND POSTERIOR REPAIR (RECTOCELE);  Surgeon: Juluis Mire;  Location: [facility];  Service: Gynecology;  Laterality: N/A;  Sacrospinous ligament suspension  . Bladder suspension  05/22/2011    Procedure: TRANSVAGINAL TAPE (TVT) PROCEDURE;  Surgeon: Juluis Mire;  Location: [facility];  Service: Gynecology;  Laterality: N/A;  Mid urethral sling  . Cystoscopy  05/22/2011    Procedure: CYSTOSCOPY;  Surgeon: Juluis Mire;  Location: [facility];  Service: Gynecology;  Laterality: N/A;    History reviewed. No pertinent family history. Social History:  reports that she has never smoked. She does not have any smokeless tobacco history on file. She reports that she drinks alcohol. She reports that she does not use illicit drugs.  Allergies: No Known Allergies  Medications Prior to Admission  Medication Sig Dispense Refill  . ibuprofen (ADVIL,MOTRIN) 200 MG tablet Take 400 mg by mouth as needed. Prn headache        . Magnesium 400 MG CAPS Take 2 capsules by mouth daily. otc       . Multiple Vitamin (MULTIVITAMIN) capsule Take 1 capsule by mouth daily.      Marland Kitchen escitalopram (LEXAPRO) 20 MG tablet  Take 20 mg by mouth daily.        . Omega-3 Fatty Acids (OMEGA-3 PO) Take 2 capsules by mouth daily. Pt takes OTC product called Omega XL         No results found for this or any previous visit (from the past 48 hour(s)).  No results found.   A comprehensive review of systems was negative.  Blood pressure 119/56, pulse 73, temperature 98.1 F (36.7 C), temperature source Oral, resp. rate 18, SpO2 98.00%.  General appearance: alert, cooperative and appears stated age Head: Normocephalic, without obvious abnormality, atraumatic Neck: supple, symmetrical, trachea midline Resp: clear to auscultation bilaterally Cardio: regular rate and rhythm GI: non tender Extremities: intact sensation and capillary refill all digits, though left thumb feels different.  +epl/fpl/io.  wound x 2 left thumb distal phalanx.  no purulence.  no erythema.  no other wounds. Pulses: 2+ and symmetric Skin: Skin color, texture, turgor normal. No rashes or lesions Neurologic: Grossly normal Incision/Wound: As above  Assessment/Plan Left thumb foreign body.  Non operative and operative treatment options were discussed with the patient and patient wishes to proceed with operative treatment. Risks, benefits, and alternatives of surgery were discussed and the patient agrees with the plan of care.   Jasmine Kelly R 06/26/2013, 12:03 PM

## 2013-06-26 NOTE — Brief Op Note (Signed)
06/26/2013  1:31 PM  PATIENT:  Shepard General  57 y.o. female  PRE-OPERATIVE DIAGNOSIS:  left thumb foreign body   POST-OPERATIVE DIAGNOSIS:  left thumb foreign body   PROCEDURE:  Procedure(s): MINOR EXCISION OF MASS left thumb foreign body removal  (Left)  SURGEON:  Surgeon(s) and Role:    * Tami Ribas, MD - Primary  PHYSICIAN ASSISTANT:   ASSISTANTS: none   ANESTHESIA:   local  EBL:     BLOOD ADMINISTERED:none  DRAINS: vessel loop drain  LOCAL MEDICATIONS USED:  MARCAINE    and LIDOCAINE   SPECIMEN:  No Specimen  DISPOSITION OF SPECIMEN:  N/A  COUNTS:  YES  TOURNIQUET:  penrose drain 20 minutes  DICTATION: .Other Dictation: Dictation Number [id number]  PLAN OF CARE: Discharge to home after PACU  PATIENT DISPOSITION:  PACU - hemodynamically stable.

## 2013-06-26 NOTE — OR Nursing (Signed)
Dr. Karlyn Agee placed a vessel loop in wound for a drain in left thumb. Dressed with xeroform, gauze and coban.

## 2013-06-26 NOTE — Op Note (Signed)
[id number] 

## 2013-06-29 ENCOUNTER — Encounter (HOSPITAL_BASED_OUTPATIENT_CLINIC_OR_DEPARTMENT_OTHER): Payer: Self-pay | Admitting: Orthopedic Surgery

## 2013-06-29 NOTE — Op Note (Signed)
Jasmine Kelly, Jasmine Kelly NO.:  [id number]  MEDICAL RECORD NO.:  [id number]  LOCATION:                               FACILITY:  [facility]  PHYSICIAN:  Betha Loa, MD        DATE OF BIRTH:  1956-04-29  DATE OF PROCEDURE:  06/26/2013 DATE OF DISCHARGE:  06/26/2013                              OPERATIVE REPORT   PREOPERATIVE DIAGNOSIS:  Left thumb foreign body.  POSTOPERATIVE DIAGNOSIS:  Left thumb foreign body.  PROCEDURE:  Removal of foreign body, left thumb.  SURGEON:  Betha Loa, MD  ASSISTANT:  None.  ANESTHESIA:  Digital block with 10 mL half and half 1% plain lidocaine and 1.25% plain Marcaine.  TOURNIQUET:  Penrose drain approximately 20 minutes.  DISPOSITION:  Stable.  INDICATIONS:  Mr. Morrill is a 56 year old female who yesterday while at work, suffered an injury to her left thumb which a sewing machine needle broke off in the thumb.  She was seen at [facility] where portion of the foreign body was able to be removed but was not able to be removed in its entirety.  She was referred to me for care.  She presented to the office this morning.  We discussed nature of the injury.  I recommended removal of the foreign body in the operating room.  Risks, benefits, alternatives surgery were discussed including risk of blood loss, infection, damage to nerves, vessels, tendons, ligaments, bone; failure of surgery; need for additional surgery, complications with wound healing, continued pain, and retained foreign body.  She voiced understanding of these risks and elected to proceed.  OPERATIVE COURSE:  After being identified preoperatively by myself, the patient and I agreed upon procedure and site procedure.  Surgical site was marked.  The risks, benefits, and alternatives of surgery were reviewed and she wished to proceed.  Surgical consent had been signed. She was transferred to the operating room and placed on the operating room table in supine  position with left upper extremity arm board. Surgical pause performed between myself, the operating staff, and the patient, all were in agreement as to patient, procedure, and site of procedure.  A digital block was performed with 10 mL half and half solution of 1% plain lidocaine and 0.25% plain Marcaine.  This was adequate to give total digital anesthesia to the left thumb.  Penrose drain was used as a tourniquet was up for approximately 20 minutes.  The wound was opened.  There was no purulence.  The larger piece of foreign body was able to be easily retrieved.  There was a small piece of foreign body remaining.  C-arm was used and AP and lateral projections to aid in retrieval.  The remaining small piece foreign body was able to be removed after extending the radial-sided incision slightly distally. The wound was copiously irrigated with sterile saline.  The surgical portion of the wound was closed with 5-0 nylon in a horizontal mattress fashion.  A vessel loop drain was placed in the ulnar-sided wound to allow drainage as necessary.  She tolerated the procedure well.  The Penrose drain was removed and the wounds dressed with sterile Xeroform, 4x4  and wrapped with a Coban dressing lightly. She tolerated the procedure well.  I will see her back in the office in 1 week.  In 3 days, she will remove the dressing and pull the vessel loop drain and start daily hypertonic saline soaks.  She was given pain medication antibiotic by urgent care facility yesterday and she will continue these.     Betha Loa, MD     KK/MEDQ  D:  06/26/2013  T:  06/27/2013  Job:  [id number]

## 2013-12-03 ENCOUNTER — Other Ambulatory Visit: Payer: Self-pay

## 2013-12-03 DIAGNOSIS — Z1231 Encounter for screening mammogram for malignant neoplasm of breast: Secondary | ICD-10-CM

## 2014-01-04 ENCOUNTER — Ambulatory Visit
Admission: RE | Admit: 2014-01-04 | Discharge: 2014-01-04 | Disposition: A | Discharge status: Home / Self Care | Payer: 59 | Source: Ambulatory Visit

## 2014-01-04 ENCOUNTER — Encounter (INDEPENDENT_AMBULATORY_CARE_PROVIDER_SITE_OTHER): Payer: Self-pay

## 2014-01-04 DIAGNOSIS — Z1231 Encounter for screening mammogram for malignant neoplasm of breast: Secondary | ICD-10-CM

## 2014-11-29 ENCOUNTER — Other Ambulatory Visit: Payer: Self-pay

## 2014-11-29 DIAGNOSIS — Z1231 Encounter for screening mammogram for malignant neoplasm of breast: Secondary | ICD-10-CM

## 2015-01-10 ENCOUNTER — Ambulatory Visit
Admission: RE | Admit: 2015-01-10 | Discharge: 2015-01-10 | Disposition: A | Discharge status: Home / Self Care | Payer: 59 | Source: Ambulatory Visit

## 2015-01-10 DIAGNOSIS — Z1231 Encounter for screening mammogram for malignant neoplasm of breast: Secondary | ICD-10-CM

## 2015-01-25 ENCOUNTER — Other Ambulatory Visit: Payer: Self-pay | Admitting: Obstetrics and Gynecology

## 2015-12-06 ENCOUNTER — Other Ambulatory Visit: Payer: Self-pay

## 2015-12-06 DIAGNOSIS — Z1231 Encounter for screening mammogram for malignant neoplasm of breast: Secondary | ICD-10-CM

## 2016-01-11 ENCOUNTER — Ambulatory Visit
Admission: RE | Admit: 2016-01-11 | Discharge: 2016-01-11 | Disposition: A | Discharge status: Home / Self Care | Payer: Commercial Managed Care - HMO | Source: Ambulatory Visit

## 2016-01-11 DIAGNOSIS — Z1231 Encounter for screening mammogram for malignant neoplasm of breast: Secondary | ICD-10-CM

## 2016-11-22 DIAGNOSIS — H43813 Vitreous degeneration, bilateral: Secondary | ICD-10-CM | POA: Diagnosis not present

## 2016-12-10 ENCOUNTER — Other Ambulatory Visit: Payer: Self-pay | Admitting: Obstetrics and Gynecology

## 2016-12-10 DIAGNOSIS — Z1231 Encounter for screening mammogram for malignant neoplasm of breast: Secondary | ICD-10-CM

## 2016-12-26 DIAGNOSIS — H43811 Vitreous degeneration, right eye: Secondary | ICD-10-CM | POA: Diagnosis not present

## 2017-01-29 DIAGNOSIS — H43811 Vitreous degeneration, right eye: Secondary | ICD-10-CM | POA: Diagnosis not present

## 2017-01-30 ENCOUNTER — Ambulatory Visit
Admission: RE | Admit: 2017-01-30 | Discharge: 2017-01-30 | Disposition: A | Discharge status: Home / Self Care | Payer: Commercial Managed Care - HMO | Source: Ambulatory Visit | Attending: Obstetrics and Gynecology | Admitting: Obstetrics and Gynecology

## 2017-01-30 DIAGNOSIS — Z01419 Encounter for gynecological examination (general) (routine) without abnormal findings: Secondary | ICD-10-CM | POA: Diagnosis not present

## 2017-01-30 DIAGNOSIS — Z6824 Body mass index (BMI) 24.0-24.9, adult: Secondary | ICD-10-CM | POA: Diagnosis not present

## 2017-01-30 DIAGNOSIS — Z1231 Encounter for screening mammogram for malignant neoplasm of breast: Secondary | ICD-10-CM

## 2017-02-22 DIAGNOSIS — Z13820 Encounter for screening for osteoporosis: Secondary | ICD-10-CM | POA: Diagnosis not present

## 2017-02-25 DIAGNOSIS — Z800 Family history of malignant neoplasm of digestive organs: Secondary | ICD-10-CM | POA: Diagnosis not present

## 2017-03-27 DIAGNOSIS — L821 Other seborrheic keratosis: Secondary | ICD-10-CM | POA: Diagnosis not present

## 2017-03-27 DIAGNOSIS — D1801 Hemangioma of skin and subcutaneous tissue: Secondary | ICD-10-CM | POA: Diagnosis not present

## 2017-03-27 DIAGNOSIS — D225 Melanocytic nevi of trunk: Secondary | ICD-10-CM | POA: Diagnosis not present

## 2017-04-08 DIAGNOSIS — D126 Benign neoplasm of colon, unspecified: Secondary | ICD-10-CM | POA: Diagnosis not present

## 2017-04-08 DIAGNOSIS — K6389 Other specified diseases of intestine: Secondary | ICD-10-CM | POA: Diagnosis not present

## 2017-04-08 DIAGNOSIS — Z1211 Encounter for screening for malignant neoplasm of colon: Secondary | ICD-10-CM | POA: Diagnosis not present

## 2017-12-30 DIAGNOSIS — M533 Sacrococcygeal disorders, not elsewhere classified: Secondary | ICD-10-CM | POA: Diagnosis not present

## 2017-12-30 DIAGNOSIS — M7918 Myalgia, other site: Secondary | ICD-10-CM | POA: Diagnosis not present

## 2017-12-30 DIAGNOSIS — M546 Pain in thoracic spine: Secondary | ICD-10-CM | POA: Diagnosis not present

## 2017-12-30 DIAGNOSIS — M5416 Radiculopathy, lumbar region: Secondary | ICD-10-CM | POA: Diagnosis not present

## 2017-12-30 DIAGNOSIS — M545 Low back pain: Secondary | ICD-10-CM | POA: Diagnosis not present

## 2018-01-01 DIAGNOSIS — M7918 Myalgia, other site: Secondary | ICD-10-CM | POA: Diagnosis not present

## 2018-01-01 DIAGNOSIS — M533 Sacrococcygeal disorders, not elsewhere classified: Secondary | ICD-10-CM | POA: Diagnosis not present

## 2018-01-01 DIAGNOSIS — M545 Low back pain: Secondary | ICD-10-CM | POA: Diagnosis not present

## 2018-01-01 DIAGNOSIS — M5416 Radiculopathy, lumbar region: Secondary | ICD-10-CM | POA: Diagnosis not present

## 2018-01-03 DIAGNOSIS — M533 Sacrococcygeal disorders, not elsewhere classified: Secondary | ICD-10-CM | POA: Diagnosis not present

## 2018-01-03 DIAGNOSIS — M5416 Radiculopathy, lumbar region: Secondary | ICD-10-CM | POA: Diagnosis not present

## 2018-01-03 DIAGNOSIS — M7918 Myalgia, other site: Secondary | ICD-10-CM | POA: Diagnosis not present

## 2018-01-03 DIAGNOSIS — M545 Low back pain: Secondary | ICD-10-CM | POA: Diagnosis not present

## 2018-01-07 DIAGNOSIS — M533 Sacrococcygeal disorders, not elsewhere classified: Secondary | ICD-10-CM | POA: Diagnosis not present

## 2018-01-07 DIAGNOSIS — M7918 Myalgia, other site: Secondary | ICD-10-CM | POA: Diagnosis not present

## 2018-01-07 DIAGNOSIS — M545 Low back pain: Secondary | ICD-10-CM | POA: Diagnosis not present

## 2018-01-14 DIAGNOSIS — M533 Sacrococcygeal disorders, not elsewhere classified: Secondary | ICD-10-CM | POA: Diagnosis not present

## 2018-01-14 DIAGNOSIS — M545 Low back pain: Secondary | ICD-10-CM | POA: Diagnosis not present

## 2018-01-14 DIAGNOSIS — M7918 Myalgia, other site: Secondary | ICD-10-CM | POA: Diagnosis not present

## 2018-01-28 DIAGNOSIS — M533 Sacrococcygeal disorders, not elsewhere classified: Secondary | ICD-10-CM | POA: Diagnosis not present

## 2018-01-28 DIAGNOSIS — M545 Low back pain: Secondary | ICD-10-CM | POA: Diagnosis not present

## 2018-01-28 DIAGNOSIS — M7918 Myalgia, other site: Secondary | ICD-10-CM | POA: Diagnosis not present

## 2018-04-09 DIAGNOSIS — Z6824 Body mass index (BMI) 24.0-24.9, adult: Secondary | ICD-10-CM | POA: Diagnosis not present

## 2018-04-09 DIAGNOSIS — Z01419 Encounter for gynecological examination (general) (routine) without abnormal findings: Secondary | ICD-10-CM | POA: Diagnosis not present

## 2018-04-23 DIAGNOSIS — R42 Dizziness and giddiness: Secondary | ICD-10-CM | POA: Diagnosis not present

## 2018-04-23 DIAGNOSIS — H8110 Benign paroxysmal vertigo, unspecified ear: Secondary | ICD-10-CM | POA: Diagnosis not present

## 2018-04-23 DIAGNOSIS — I951 Orthostatic hypotension: Secondary | ICD-10-CM | POA: Diagnosis not present

## 2018-12-22 ENCOUNTER — Telehealth (HOSPITAL_COMMUNITY): Payer: Self-pay

## 2018-12-22 DIAGNOSIS — M7061 Trochanteric bursitis, right hip: Secondary | ICD-10-CM | POA: Diagnosis not present

## 2018-12-22 NOTE — Telephone Encounter (Signed)
Jasmine Kelly was contacted today regarding temporary reduction of Outpatient Rehabilitation Services due to concerns for community transmission of COVID-19.  Patient identity was verified.  Assessed if patient needed to be seen in person by clinician (recent fall or acute injury that requires hands on assessment and advice, change in diet order, post-surgical, special cases, etc.).     Patient did not have an acute/special need that requires in person visit. Proceeded with phone call.  The patient expressed interest in being contacted for a video visit to continue their plan of care when those services become available.  The patient verified that they have a video device and internet access for a telehealth visit. Also educated pt that she can contact our office if her pain significantly increases and we could discuss an in-clinic evaluation at that time.   Patient is aware we can be reached by telephone during limited business hours in the meantime, [phone].  Geraldine Solar PT, DPT

## 2020-07-29 ENCOUNTER — Ambulatory Visit
Admission: EM | Admit: 2020-07-29 | Discharge: 2020-07-29 | Disposition: A | Discharge status: Home / Self Care | Payer: 59

## 2020-07-29 DIAGNOSIS — R059 Cough, unspecified: Secondary | ICD-10-CM

## 2020-07-29 DIAGNOSIS — J3489 Other specified disorders of nose and nasal sinuses: Secondary | ICD-10-CM | POA: Diagnosis not present

## 2020-07-29 DIAGNOSIS — H10011 Acute follicular conjunctivitis, right eye: Secondary | ICD-10-CM

## 2020-07-29 HISTORY — DX: Depression, unspecified: F32.A

## 2020-07-29 MED ORDER — IPRATROPIUM BROMIDE 0.03 % NA SOLN: 2.0000 | Freq: Two times a day (BID) | NASAL | 0 refills | Status: AC

## 2020-07-29 MED ORDER — BENZONATATE 100 MG PO CAPS: 100.0000 mg | ORAL_CAPSULE | Freq: Three times a day (TID) | ORAL | 0 refills | Status: DC | PRN

## 2020-07-29 MED ORDER — ERYTHROMYCIN 5 MG/GM OP OINT: TOPICAL_OINTMENT | OPHTHALMIC | 0 refills | Status: DC

## 2020-07-29 MED ORDER — CETIRIZINE HCL 10 MG PO TABS: 10.0000 mg | ORAL_TABLET | Freq: Every day | ORAL | 11 refills | Status: DC

## 2020-07-29 NOTE — ED Provider Notes (Signed)
[facility]    CSN: [id number] Arrival date & time: 07/29/20  1028      History   Chief Complaint Chief Complaint  Patient presents with  . Facial Pain    HPI Jasmine Kelly is a 64 y.o. female.   HPI  Patient presents today with 2 days of nasal congestion, facial pressure, eye redness and drainage, cough.  Right eye drainage, irritated, itchy since yesterday.  She is requesting a COVID-19 test.  She is afebrile however has been taken Advil for symptom management.  She has not tried any antihistamines or any other URI symptom management medication.  Past Medical History:  Diagnosis Date  . Depression     There are no problems to display for this patient.   Past Surgical History:  Procedure Laterality Date  . ANTERIOR AND POSTERIOR REPAIR  05/22/2011   Procedure: ANTERIOR (CYSTOCELE) AND POSTERIOR REPAIR (RECTOCELE);  Surgeon: Darlyn Chamber;  Location: [facility];  Service: Gynecology;  Laterality: N/A;  Sacrospinous ligament suspension  . BLADDER SUSPENSION  05/22/2011   Procedure: TRANSVAGINAL TAPE (TVT) PROCEDURE;  Surgeon: Darlyn Chamber;  Location: [facility];  Service: Gynecology;  Laterality: N/A;  Mid urethral sling  . CYSTOSCOPY  05/22/2011   Procedure: CYSTOSCOPY;  Surgeon: Gwynn Burly McComb;  Location: [facility];  Service: Gynecology;  Laterality: N/A;  . DILATION AND CURETTAGE OF UTERUS    . LAPAROSCOPIC ASSISTED VAGINAL HYSTERECTOMY  05/22/2011   Procedure: LAPAROSCOPIC ASSISTED VAGINAL HYSTERECTOMY;  Surgeon: Darlyn Chamber;  Location: [facility];  Service: Gynecology;  Laterality: Bilateral;  bilateral salpingo-oophorectomy  . MASS EXCISION Left 06/26/2013   Procedure: MINOR EXCISION OF MASS left thumb foreign body removal ;  Surgeon: Tennis Must, MD;  Location: [facility];  Service: Orthopedics;  Laterality: Left;    OB History   No obstetric history on file.      Home Medications    Prior to Admission medications   Medication Sig Start Date  End Date Taking? Authorizing Provider  Cholecalciferol (VITAMIN D) 10 MCG/ML LIQD Vitamin D   Yes [provider]  ibuprofen (ADVIL,MOTRIN) 200 MG tablet Take 400 mg by mouth as needed. Prn headache   Yes [provider]  Mag Aspart-Potassium Aspart (POTASSIUM & MAGNESIUM ASPARTAT) 250-250 MG CAPS 1 capsule with a meal   Yes [provider]  Magnesium 400 MG CAPS Take 2 capsules by mouth daily. otc   Yes [provider]  Multiple Vitamin (MULTIVITAMIN) capsule Take 1 capsule by mouth daily.   Yes [provider]  Omega-3 Fatty Acids (OMEGA-3 PO) Take 2 capsules by mouth daily. Pt takes OTC product called Omega XL   Yes [provider]  PARoxetine (PAXIL) 20 MG tablet paroxetine 20 mg tablet  TAKE 1 TABLET BY MOUTH EVERY DAY IN THE MORNING   Yes [provider]  escitalopram (LEXAPRO) 20 MG tablet Take 20 mg by mouth daily.    [provider]    Family History No family history on file.  Social History Social History   Tobacco Use  . Smoking status: Never Smoker  Substance Use Topics  . Alcohol use: Yes    Comment: occasionally  . Drug use: No     Allergies   Patient has no known allergies.   Review of Systems Review of Systems Pertinent negatives listed in HPI   Physical Exam Triage Vital Signs ED Triage Vitals  Enc Vitals Group     BP  07/29/20 1118 111/71     Pulse Rate 07/29/20 1118 64     Resp --      Temp 07/29/20 1118 97.7 F (36.5 C)     Temp Source 07/29/20 1118 Oral     SpO2 07/29/20 1118 95 %     Weight 07/29/20 1115 155 lb (70.3 kg)     Height 07/29/20 1115 5\' 6"  (1.676 m)     Head Circumference --      Peak Flow --      Pain Score 07/29/20 1114 3     Pain Loc --      Pain Edu? --      Excl. in [place]? --    No data found.  Updated Vital Signs BP 111/71 (BP Location: Right Arm)   Pulse 64   Temp 97.7 F (36.5 C) (Oral)   Ht 5\' 6"  (1.676 m)   Wt 155 lb (70.3 kg)   SpO2 95%    BMI 25.02 kg/m   Visual Acuity Right Eye Distance:   Left Eye Distance:   Bilateral Distance:    Right Eye Near:   Left Eye Near:    Bilateral Near:     Physical Exam Constitutional:      Appearance: She is ill-appearing.  HENT:     Head: Normocephalic and atraumatic.     Nose: Nasal deformity present.     Right Turbinates: Enlarged.     Right Sinus: Frontal sinus tenderness present.  Eyes:     Conjunctiva/sclera:     Right eye: Right conjunctiva is injected. Exudate and hemorrhage present.  Cardiovascular:     Rate and Rhythm: Normal rate and regular rhythm.  Pulmonary:     Effort: Pulmonary effort is normal.     Breath sounds: Normal breath sounds and air entry.  Neurological:     Mental Status: She is alert.      UC Treatments / Results  Labs (all labs ordered are listed, but only abnormal results are displayed) Labs Reviewed  NOVEL CORONAVIRUS, NAA    EKG   Radiology No results found.  Procedures Procedures (including critical care time)  Medications Ordered in UC Medications - No data to display  Initial Impression / Assessment and Plan / UC Course  I have reviewed the triage vital signs and the nursing notes.  Pertinent labs & imaging results that were available during my care of the patient were reviewed by me and considered in my medical decision making (see chart for details).     COVID-19 test pending.  Treating for conjunctivitis.  Symptom management for nasal symptoms and cough.  Patient advised to quarantine until results are known.  She can contact office within 72 hours for results. Final Clinical Impressions(s) / UC Diagnoses   Final diagnoses:  Sinus pressure  Acute follicular conjunctivitis of right eye  Cough   Discharge Instructions   None    ED Prescriptions    Medication Sig Dispense Auth. Provider   erythromycin ophthalmic ointment Place a 1/2 inch ribbon of ointment into the right lower eyelid x 5 days 3.5 g Scot Jun, FNP   ipratropium (ATROVENT) 0.03 % nasal spray Place 2 sprays into both nostrils 2 (two) times daily. 30 mL Scot Jun, FNP   benzonatate (TESSALON) 100 MG capsule Take 1-2 capsules (100-200 mg total) by mouth 3 (three) times daily as needed for cough. 40 capsule Scot Jun, FNP   cetirizine (ZYRTEC) 10 MG tablet Take 1  tablet (10 mg total) by mouth daily. 30 tablet Scot Jun, FNP     PDMP not reviewed this encounter.   Scot Jun, FNP 07/29/20 [phone]

## 2020-07-29 NOTE — ED Triage Notes (Signed)
x2 days. Facial pain, and head congestion. Pt states that she has some nasal congestion as well. Pt states that she has some redness of her red eye and some drainage. Pt states that she is vaccinated.

## 2020-07-31 LAB — SARS-COV-2, NAA 2 DAY TAT

## 2020-07-31 LAB — NOVEL CORONAVIRUS, NAA: SARS-CoV-2, NAA: NOT DETECTED

## 2020-08-29 ENCOUNTER — Other Ambulatory Visit: Payer: Self-pay | Admitting: Family Medicine

## 2021-06-03 ENCOUNTER — Other Ambulatory Visit: Payer: Self-pay

## 2021-06-03 ENCOUNTER — Ambulatory Visit
Admission: EM | Admit: 2021-06-03 | Discharge: 2021-06-03 | Disposition: A | Discharge status: Home / Self Care | Payer: 59 | Attending: Urgent Care | Admitting: Urgent Care

## 2021-06-03 DIAGNOSIS — J3489 Other specified disorders of nose and nasal sinuses: Secondary | ICD-10-CM

## 2021-06-03 DIAGNOSIS — J018 Other acute sinusitis: Secondary | ICD-10-CM | POA: Diagnosis not present

## 2021-06-03 DIAGNOSIS — J3089 Other allergic rhinitis: Secondary | ICD-10-CM

## 2021-06-03 MED ORDER — AMOXICILLIN 875 MG PO TABS: 875.0000 mg | ORAL_TABLET | Freq: Two times a day (BID) | ORAL | 0 refills | Status: DC

## 2021-06-03 MED ORDER — PSEUDOEPHEDRINE HCL 60 MG PO TABS: 60.0000 mg | ORAL_TABLET | Freq: Three times a day (TID) | ORAL | 0 refills | Status: DC | PRN

## 2021-06-03 NOTE — ED Provider Notes (Signed)
[place]   MRN: [id number] DOB: Jun 13, 1956  Subjective:   Jasmine Kelly is a 65 y.o. female presenting for 8-9 day history of acute onset persistent and worsening sinus pressure, sinus congestion, sinus pain over the frontal part of her head.  Denies fever, confusion, weakness, nausea or tingling, vision changes, neck pain or stiffness, sore throat, cough, chest pain, shortness of breath.  She has a history of allergies, takes Zyrtec daily.  Also has a nasal spray.  Does not want to be COVID tested.  No current facility-administered medications for this encounter.  Current Outpatient Medications:    benzonatate (TESSALON) 100 MG capsule, Take 1-2 capsules (100-200 mg total) by mouth 3 (three) times daily as needed for cough., Disp: 40 capsule, Rfl: 0   cetirizine (ZYRTEC) 10 MG tablet, Take 1 tablet (10 mg total) by mouth daily., Disp: 30 tablet, Rfl: 11   Cholecalciferol (VITAMIN D) 10 MCG/ML LIQD, Vitamin D, Disp: , Rfl:    erythromycin ophthalmic ointment, Place a 1/2 inch ribbon of ointment into the right lower eyelid x 5 days, Disp: 3.5 g, Rfl: 0   escitalopram (LEXAPRO) 20 MG tablet, Take 20 mg by mouth daily., Disp: , Rfl:    ibuprofen (ADVIL,MOTRIN) 200 MG tablet, Take 400 mg by mouth as needed. Prn headache, Disp: , Rfl:    ipratropium (ATROVENT) 0.03 % nasal spray, Place 2 sprays into both nostrils 2 (two) times daily., Disp: 30 mL, Rfl: 0   Mag Aspart-Potassium Aspart (POTASSIUM & MAGNESIUM ASPARTAT) 250-250 MG CAPS, 1 capsule with a meal, Disp: , Rfl:    Magnesium 400 MG CAPS, Take 2 capsules by mouth daily. otc, Disp: , Rfl:    Multiple Vitamin (MULTIVITAMIN) capsule, Take 1 capsule by mouth daily., Disp: , Rfl:    Omega-3 Fatty Acids (OMEGA-3 PO), Take 2 capsules by mouth daily. Pt takes OTC product called Omega XL, Disp: , Rfl:    PARoxetine (PAXIL) 20 MG tablet, paroxetine 20 mg tablet  TAKE 1 TABLET BY MOUTH EVERY DAY IN THE MORNING, Disp: , Rfl:    No  Known Allergies  Past Medical History:  Diagnosis Date   Depression      Past Surgical History:  Procedure Laterality Date   ANTERIOR AND POSTERIOR REPAIR  05/22/2011   Procedure: ANTERIOR (CYSTOCELE) AND POSTERIOR REPAIR (RECTOCELE);  Surgeon: Darlyn Chamber;  Location: [facility];  Service: Gynecology;  Laterality: N/A;  Sacrospinous ligament suspension   BLADDER SUSPENSION  05/22/2011   Procedure: TRANSVAGINAL TAPE (TVT) PROCEDURE;  Surgeon: Darlyn Chamber;  Location: [facility];  Service: Gynecology;  Laterality: N/A;  Mid urethral sling   CYSTOSCOPY  05/22/2011   Procedure: CYSTOSCOPY;  Surgeon: Gwynn Burly McComb;  Location: [facility];  Service: Gynecology;  Laterality: N/A;   DILATION AND CURETTAGE OF UTERUS     LAPAROSCOPIC ASSISTED VAGINAL HYSTERECTOMY  05/22/2011   Procedure: LAPAROSCOPIC ASSISTED VAGINAL HYSTERECTOMY;  Surgeon: Darlyn Chamber;  Location: [facility];  Service: Gynecology;  Laterality: Bilateral;  bilateral salpingo-oophorectomy   MASS EXCISION Left 06/26/2013   Procedure: MINOR EXCISION OF MASS left thumb foreign body removal ;  Surgeon: Tennis Must, MD;  Location: [facility];  Service: Orthopedics;  Laterality: Left;    History reviewed. No pertinent family history.  Social History   Tobacco Use   Smoking status: Never  Substance Use Topics   Alcohol use: Yes    Comment: occasionally   Drug use: No    ROS  Objective:   Vitals: BP 101/66 (BP Location: Right Arm)   Pulse (!) 56   Temp 98.2 F (36.8 C) (Oral)   Resp 18   SpO2 96%   Physical Exam Constitutional:      General: She is not in acute distress.    Appearance: She is well-developed. She is not ill-appearing, toxic-appearing or diaphoretic.  HENT:     Head: Normocephalic and atraumatic.     Right Ear: Tympanic membrane, ear canal and external ear normal. No drainage or tenderness. No middle ear effusion. Tympanic membrane is not erythematous.     Left Ear: Tympanic membrane, ear canal and  external ear normal. No drainage or tenderness.  No middle ear effusion. Tympanic membrane is not erythematous.     Nose: No congestion or rhinorrhea.     Mouth/Throat:     Mouth: Mucous membranes are moist. No oral lesions.     Pharynx: Oropharynx is clear. No pharyngeal swelling, oropharyngeal exudate, posterior oropharyngeal erythema or uvula swelling.     Tonsils: No tonsillar exudate or tonsillar abscesses.  Eyes:     General: No scleral icterus.       Right eye: No discharge.        Left eye: No discharge.     Extraocular Movements: Extraocular movements intact.     Right eye: Normal extraocular motion.     Left eye: Normal extraocular motion.     Conjunctiva/sclera: Conjunctivae normal.     Pupils: Pupils are equal, round, and reactive to light.  Cardiovascular:     Rate and Rhythm: Normal rate.  Pulmonary:     Effort: Pulmonary effort is normal.  Musculoskeletal:     Cervical back: Normal range of motion and neck supple.  Lymphadenopathy:     Cervical: No cervical adenopathy.  Skin:    General: Skin is warm and dry.  Neurological:     General: No focal deficit present.     Mental Status: She is alert and oriented to person, place, and time.     Cranial Nerves: No cranial nerve deficit.     Motor: No weakness.     Coordination: Coordination normal.     Gait: Gait normal.     Deep Tendon Reflexes: Reflexes normal.  Psychiatric:        Mood and Affect: Mood normal.        Behavior: Behavior normal.     Assessment and Plan :   PDMP not reviewed this encounter.  1. Acute non-recurrent sinusitis of other sinus   2. Sinus pressure   3. Allergic rhinitis due to other allergic trigger, unspecified seasonality    Will start empiric treatment for sinusitis with amoxicillin.  Recommended supportive care otherwise including the continued use of oral antihistamine, add Sudafed. Counseled patient on potential for adverse effects with medications prescribed/recommended today,  ER and return-to-clinic precautions discussed, patient verbalized understanding.    Jaynee Eagles, PA-C 06/03/21 1157

## 2021-06-03 NOTE — ED Triage Notes (Signed)
Patient reports having green mucous, headache and facial pressure for over a week.  Home interventions: Zyrtec

## 2021-08-17 ENCOUNTER — Other Ambulatory Visit: Payer: Self-pay

## 2021-08-17 ENCOUNTER — Ambulatory Visit
Admission: RE | Admit: 2021-08-17 | Discharge: 2021-08-17 | Disposition: A | Discharge status: Home / Self Care | Payer: 59 | Source: Ambulatory Visit | Attending: Emergency Medicine | Admitting: Emergency Medicine

## 2021-08-17 VITALS — BP 105/66 | HR 69 | Temp 98.6°F | Resp 18

## 2021-08-17 DIAGNOSIS — J0140 Acute pansinusitis, unspecified: Secondary | ICD-10-CM | POA: Diagnosis not present

## 2021-08-17 DIAGNOSIS — J988 Other specified respiratory disorders: Secondary | ICD-10-CM

## 2021-08-17 DIAGNOSIS — Z9189 Other specified personal risk factors, not elsewhere classified: Secondary | ICD-10-CM

## 2021-08-17 DIAGNOSIS — B9789 Other viral agents as the cause of diseases classified elsewhere: Secondary | ICD-10-CM

## 2021-08-17 DIAGNOSIS — Z20822 Contact with and (suspected) exposure to covid-19: Secondary | ICD-10-CM

## 2021-08-17 MED ORDER — AMOXICILLIN-POT CLAVULANATE 875-125 MG PO TABS: 1.0000 | ORAL_TABLET | Freq: Two times a day (BID) | ORAL | 0 refills | Status: DC

## 2021-08-17 MED ORDER — FLUTICASONE PROPIONATE 50 MCG/ACT NA SUSP: 2.0000 | Freq: Every day | NASAL | 0 refills | Status: AC

## 2021-08-17 NOTE — ED Triage Notes (Signed)
Headache since Sunday.  Body aches yesterday with nasal congestion - green mucus history of sinus infection 3 months ago.

## 2021-08-17 NOTE — Discharge Instructions (Signed)
If COVID testing comes back in time, I will start you on Molnupiravir if it is positive.  In the meantime, saline nasal irrigation with a NeilMed sinus rinse and distilled water as often as you want, discontinue Zyrtec, Mucinex D, Flonase.  Wait-and-see prescription of Augmentin.

## 2021-08-17 NOTE — ED Provider Notes (Signed)
HPI  SUBJECTIVE:  Jasmine Kelly is a 65 y.o. female who presents with 4 days of headaches, body aches, nasal congestion, green rhinorrhea, cough productive of same material as her nasal congestion, maxillary and frontal sinus pressure, upper dental pain and postnasal drip.  No fevers, facial swelling, sore throat, wheezing, shortness of breath, nausea, vomiting, diarrhea.  No known COVID or flu exposure.  She got 2 doses of the COVID-vaccine.  She has not yet gotten the flu vaccine.  She had negative home COVID test last night.  No antibiotics in the past month.  She took ibuprofen 600 mg within 6 hours of evaluation.  She has also tried Zyrtec, Sudafed, NyQuil and Mucinex.  The ibuprofen, NyQuil and Mucinex help.  No aggravating factors.  She has a past medical history of sinusitis, COVID in December 2021.  PMD: [facility]    Past Medical History:  Diagnosis Date   Depression     Past Surgical History:  Procedure Laterality Date   ANTERIOR AND POSTERIOR REPAIR  05/22/2011   Procedure: ANTERIOR (CYSTOCELE) AND POSTERIOR REPAIR (RECTOCELE);  Surgeon: Darlyn Chamber;  Location: [facility];  Service: Gynecology;  Laterality: N/A;  Sacrospinous ligament suspension   BLADDER SUSPENSION  05/22/2011   Procedure: TRANSVAGINAL TAPE (TVT) PROCEDURE;  Surgeon: Darlyn Chamber;  Location: [facility];  Service: Gynecology;  Laterality: N/A;  Mid urethral sling   CYSTOSCOPY  05/22/2011   Procedure: CYSTOSCOPY;  Surgeon: Gwynn Burly McComb;  Location: [facility];  Service: Gynecology;  Laterality: N/A;   DILATION AND CURETTAGE OF UTERUS     LAPAROSCOPIC ASSISTED VAGINAL HYSTERECTOMY  05/22/2011   Procedure: LAPAROSCOPIC ASSISTED VAGINAL HYSTERECTOMY;  Surgeon: Darlyn Chamber;  Location: [facility];  Service: Gynecology;  Laterality: Bilateral;  bilateral salpingo-oophorectomy   MASS EXCISION Left 06/26/2013   Procedure: MINOR EXCISION OF MASS left thumb foreign body removal ;  Surgeon: Tennis Must, MD;   Location: [facility];  Service: Orthopedics;  Laterality: Left;    History reviewed. No pertinent family history.  Social History   Tobacco Use   Smoking status: Never  Substance Use Topics   Alcohol use: Yes    Comment: occasionally   Drug use: No    No current facility-administered medications for this encounter.  Current Outpatient Medications:    amoxicillin (AMOXIL) 875 MG tablet, Take 1 tablet (875 mg total) by mouth 2 (two) times daily., Disp: 14 tablet, Rfl: 0   benzonatate (TESSALON) 100 MG capsule, Take 1-2 capsules (100-200 mg total) by mouth 3 (three) times daily as needed for cough., Disp: 40 capsule, Rfl: 0   cetirizine (ZYRTEC) 10 MG tablet, Take 1 tablet (10 mg total) by mouth daily., Disp: 30 tablet, Rfl: 11   Cholecalciferol (VITAMIN D) 10 MCG/ML LIQD, Vitamin D, Disp: , Rfl:    erythromycin ophthalmic ointment, Place a 1/2 inch ribbon of ointment into the right lower eyelid x 5 days, Disp: 3.5 g, Rfl: 0   escitalopram (LEXAPRO) 20 MG tablet, Take 20 mg by mouth daily., Disp: , Rfl:    ibuprofen (ADVIL,MOTRIN) 200 MG tablet, Take 400 mg by mouth as needed. Prn headache, Disp: , Rfl:    ipratropium (ATROVENT) 0.03 % nasal spray, Place 2 sprays into both nostrils 2 (two) times daily., Disp: 30 mL, Rfl: 0   Mag Aspart-Potassium Aspart (POTASSIUM & MAGNESIUM ASPARTAT) 250-250 MG CAPS, 1 capsule with a meal, Disp: , Rfl:    Magnesium 400 MG CAPS, Take  2 capsules by mouth daily. otc, Disp: , Rfl:    Multiple Vitamin (MULTIVITAMIN) capsule, Take 1 capsule by mouth daily., Disp: , Rfl:    Omega-3 Fatty Acids (OMEGA-3 PO), Take 2 capsules by mouth daily. Pt takes OTC product called Omega XL, Disp: , Rfl:    PARoxetine (PAXIL) 20 MG tablet, paroxetine 20 mg tablet  TAKE 1 TABLET BY MOUTH EVERY DAY IN THE MORNING, Disp: , Rfl:    pseudoephedrine (SUDAFED) 60 MG tablet, Take 1 tablet (60 mg total) by mouth every 8 (eight) hours as needed for congestion., Disp: 30  tablet, Rfl: 0  No Known Allergies   ROS  As noted in HPI.   Physical Exam  BP 105/66 (BP Location: Right Arm)    Pulse 69    Temp 98.6 F (37 C) (Oral)    Resp 18    SpO2 96%   Constitutional: Well developed, well nourished, no acute distress Eyes:  EOMI, conjunctiva normal bilaterally HENT: Normocephalic, atraumatic,mucus membranes moist.  Erythematous, swollen turbinates.  Clear nasal congestion.  Positive mild maxillary, frontal sinus tenderness.  No appreciable postnasal drip. Respiratory: Normal inspiratory effort, lungs clear bilaterally. Cardiovascular: Normal rate, regular rhythm, no murmurs, rubs, gallops GI: nondistended skin: No rash, skin intact Musculoskeletal: no deformities Neurologic: Alert & oriented x 3, no focal neuro deficits Psychiatric: Speech and behavior appropriate   ED Course   Medications - No data to display  Orders Placed This Encounter  Procedures   Covid-19, Flu A+B ([facility])    Standing Status:   Standing    Number of Occurrences:   1    No results found for this or any previous visit (from the past 24 hour(s)). No results found.  ED Clinical Impression  1. At increased risk of exposure to COVID-19 virus      ED Assessment/Plan  Patient with a viral respiratory infection/sinusitis.  She does not meet any criteria for antibiotics yet in the absence of fevers, double sickening, or symptoms for more than 10 days.  COVID, flu sent.  Unfortunately, she is out of the treatment window for influenza.  If COVID comes back in time, plan to start her on Molnupiravir.  In the meantime, saline nasal irrigation, discontinue Zyrtec, Mucinex D, Flonase.  Wait-and-see prescription of Augmentin.  Went over indications for starting this.  Follow-up with PMD as needed.  COVID, flu pending at the time of signing of this note.  Discussed labs, MDM, treatment plan, and plan for follow-up with patient. patient agrees with plan.   No orders of the defined  types were placed in this encounter.     *This clinic note was created using Dragon dictation software. Therefore, there may be occasional mistakes despite careful proofreading.  ?    Melynda Ripple, MD 08/18/21 1255

## 2021-08-18 LAB — COVID-19, FLU A+B NAA
Influenza A, NAA: NOT DETECTED
Influenza B, NAA: NOT DETECTED
SARS-CoV-2, NAA: NOT DETECTED

## 2021-11-20 ENCOUNTER — Ambulatory Visit
Admission: EM | Admit: 2021-11-20 | Discharge: 2021-11-20 | Disposition: A | Discharge status: Home / Self Care | Payer: 59 | Attending: Family Medicine | Admitting: Family Medicine

## 2021-11-20 DIAGNOSIS — J069 Acute upper respiratory infection, unspecified: Secondary | ICD-10-CM | POA: Diagnosis not present

## 2021-11-20 MED ORDER — PROMETHAZINE-DM 6.25-15 MG/5ML PO SYRP: 5.0000 mL | ORAL_SOLUTION | Freq: Four times a day (QID) | ORAL | 0 refills | Status: DC | PRN

## 2021-11-20 NOTE — ED Provider Notes (Signed)
?[place] [facility] ? ? ? ?CSN: [id number] ?Arrival date & time: 11/20/21  0908 ? ? ?  ? ?History   ?Chief Complaint ?Chief Complaint  ?Patient presents with  ? Nasal Congestion  ? ? ?HPI ?Jasmine Kelly is a 65 y.o. female.  ? ?Presenting today with 3-day history of fatigue, malaise, congestion, cough.  Denies chest pain, shortness of breath, abdominal pain, nausea vomiting or diarrhea.  No known sick contacts recently.  Trying over-the-counter cold and congestion medications with minimal relief.  No known history of chronic pulmonary disease. ? ? ?Past Medical History:  ?Diagnosis Date  ? Depression   ? ? ?There are no problems to display for this patient. ? ? ?Past Surgical History:  ?Procedure Laterality Date  ? ANTERIOR AND POSTERIOR REPAIR  05/22/2011  ? Procedure: ANTERIOR (CYSTOCELE) AND POSTERIOR REPAIR (RECTOCELE);  Surgeon: Darlyn Chamber;  Location: [facility];  Service: Gynecology;  Laterality: N/A;  Sacrospinous ligament suspension  ? BLADDER SUSPENSION  05/22/2011  ? Procedure: TRANSVAGINAL TAPE (TVT) PROCEDURE;  Surgeon: Darlyn Chamber;  Location: [facility];  Service: Gynecology;  Laterality: N/A;  Mid urethral sling  ? CYSTOSCOPY  05/22/2011  ? Procedure: CYSTOSCOPY;  Surgeon: Darlyn Chamber;  Location: [facility];  Service: Gynecology;  Laterality: N/A;  ? DILATION AND CURETTAGE OF UTERUS    ? LAPAROSCOPIC ASSISTED VAGINAL HYSTERECTOMY  05/22/2011  ? Procedure: LAPAROSCOPIC ASSISTED VAGINAL HYSTERECTOMY;  Surgeon: Darlyn Chamber;  Location: [facility];  Service: Gynecology;  Laterality: Bilateral;  bilateral salpingo-oophorectomy  ? MASS EXCISION Left 06/26/2013  ? Procedure: MINOR EXCISION OF MASS left thumb foreign body removal ;  Surgeon: Tennis Must, MD;  Location: [facility];  Service: Orthopedics;  Laterality: Left;  ? ? ?OB History   ?No obstetric history on file. ?  ? ? ? ?Home Medications   ? ?Prior to Admission medications   ?Medication Sig Start Date End Date Taking? Authorizing Provider   ?promethazine-dextromethorphan (PROMETHAZINE-DM) 6.25-15 MG/5ML syrup Take 5 mLs by mouth 4 (four) times daily as needed. 11/20/21  Yes Volney American, PA-C  ?amoxicillin-clavulanate (AUGMENTIN) 875-125 MG tablet Take 1 tablet by mouth 2 (two) times daily. X 7 days 08/17/21   Melynda Ripple, MD  ?Cholecalciferol (VITAMIN D) 10 MCG/ML LIQD Vitamin D    [provider]  ?fluticasone (FLONASE) 50 MCG/ACT nasal spray Place 2 sprays into both nostrils daily. 08/17/21   Melynda Ripple, MD  ?ibuprofen (ADVIL,MOTRIN) 200 MG tablet Take 400 mg by mouth as needed. Prn headache    [provider]  ?ipratropium (ATROVENT) 0.03 % nasal spray Place 2 sprays into both nostrils 2 (two) times daily. 07/29/20   Scot Jun, FNP  ?Mag Aspart-Potassium Aspart (POTASSIUM & MAGNESIUM ASPARTAT) 250-250 MG CAPS 1 capsule with a meal    [provider]  ?Magnesium 400 MG CAPS Take 2 capsules by mouth daily. otc    [provider]  ?Multiple Vitamin (MULTIVITAMIN) capsule Take 1 capsule by mouth daily.    [provider]  ?Omega-3 Fatty Acids (OMEGA-3 PO) Take 2 capsules by mouth daily. Pt takes OTC product called Omega XL    [provider]  ?PARoxetine (PAXIL) 20 MG tablet paroxetine 20 mg tablet ? TAKE 1 TABLET BY MOUTH EVERY DAY IN THE MORNING    [provider]  ? ? ?Family History ?History reviewed. No pertinent family history. ? ?Social History ?Social History  ? ?Tobacco Use  ? Smoking status: Never  ?  Substance Use Topics  ? Alcohol use: Yes  ?  Comment: occasionally  ? Drug use: No  ? ? ? ?Allergies   ?Patient has no known allergies. ? ? ?Review of Systems ?Review of Systems ?Per HPI ? ?Physical Exam ?Triage Vital Signs ?ED Triage Vitals  ?Enc Vitals Group  ?   BP 11/20/21 1043 110/69  ?   Pulse Rate 11/20/21 1043 68  ?   Resp 11/20/21 1043 20  ?   Temp 11/20/21 1043 98.4 ?F (36.9 ?C)  ?   Temp src --   ?   SpO2 11/20/21 1043 96 %  ?   Weight --   ?    Height --   ?   Head Circumference --   ?   Peak Flow --   ?   Pain Score 11/20/21 1044 3  ?   Pain Loc --   ?   Pain Edu? --   ?   Excl. in [place]? --   ? ?No data found. ? ?Updated Vital Signs ?BP 110/69   Pulse 68   Temp 98.4 ?F (36.9 ?C)   Resp 20   SpO2 96%  ? ?Visual Acuity ?Right Eye Distance:   ?Left Eye Distance:   ?Bilateral Distance:   ? ?Right Eye Near:   ?Left Eye Near:    ?Bilateral Near:    ? ?Physical Exam ?Vitals and nursing note reviewed.  ?Constitutional:   ?   Appearance: Normal appearance. She is not ill-appearing.  ?HENT:  ?   Head: Atraumatic.  ?   Right Ear: Tympanic membrane normal.  ?   Left Ear: Tympanic membrane normal.  ?   Nose: Rhinorrhea present.  ?   Mouth/Throat:  ?   Mouth: Mucous membranes are moist.  ?   Pharynx: Oropharynx is clear. Posterior oropharyngeal erythema present.  ?Eyes:  ?   Extraocular Movements: Extraocular movements intact.  ?   Conjunctiva/sclera: Conjunctivae normal.  ?Cardiovascular:  ?   Rate and Rhythm: Normal rate and regular rhythm.  ?   Heart sounds: Normal heart sounds.  ?Pulmonary:  ?   Effort: Pulmonary effort is normal.  ?   Breath sounds: Normal breath sounds. No wheezing or rales.  ?Musculoskeletal:     ?   General: Normal range of motion.  ?   Cervical back: Normal range of motion and neck supple.  ?Skin: ?   General: Skin is warm and dry.  ?Neurological:  ?   Mental Status: She is alert and oriented to person, place, and time.  ?   Motor: No weakness.  ?   Gait: Gait normal.  ?Psychiatric:     ?   Mood and Affect: Mood normal.     ?   Thought Content: Thought content normal.     ?   Judgment: Judgment normal.  ? ? ? ?UC Treatments / Results  ?Labs ?(all labs ordered are listed, but only abnormal results are displayed) ?Labs Reviewed  ?COVID-19, FLU A+B NAA  ? ? ?EKG ? ? ?Radiology ?No results found. ? ?Procedures ?Procedures (including critical care time) ? ?Medications Ordered in UC ?Medications - No data to display ? ?Initial Impression /  Assessment and Plan / UC Course  ?I have reviewed the triage vital signs and the nursing notes. ? ?Pertinent labs & imaging results that were available during my care of the patient were reviewed by me and considered in my medical decision making (see chart for details). ? ?  ? ?  Vitals and exam reassuring, suspicious for viral upper respiratory infection.  COVID and flu pending, discussed Phenergan DM and supportive over-the-counter medications and home care.  Return for acutely worsening symptoms. ? ?Final Clinical Impressions(s) / UC Diagnoses  ? ?Final diagnoses:  ?Viral URI with cough  ? ?Discharge Instructions   ?None ?  ? ?ED Prescriptions   ? ? Medication Sig Dispense Auth. Provider  ? promethazine-dextromethorphan (PROMETHAZINE-DM) 6.25-15 MG/5ML syrup Take 5 mLs by mouth 4 (four) times daily as needed. 100 mL Volney American, [place]  ? ?  ? ?PDMP not reviewed this encounter. ?  ?Volney American, PA-C ?11/20/21 1530 ? ?

## 2021-11-20 NOTE — ED Triage Notes (Signed)
Pt presents with feeling unwell on Friday then developed cough yesterday  ?

## 2021-11-21 LAB — COVID-19, FLU A+B NAA
Influenza A, NAA: NOT DETECTED
Influenza B, NAA: NOT DETECTED
SARS-CoV-2, NAA: NOT DETECTED

## 2021-11-24 ENCOUNTER — Ambulatory Visit
Admission: RE | Admit: 2021-11-24 | Discharge: 2021-11-24 | Disposition: A | Discharge status: Home / Self Care | Payer: 59 | Source: Ambulatory Visit | Attending: Family Medicine | Admitting: Family Medicine

## 2021-11-24 VITALS — BP 111/71 | HR 66 | Temp 98.0°F | Resp 18

## 2021-11-24 DIAGNOSIS — J0100 Acute maxillary sinusitis, unspecified: Secondary | ICD-10-CM

## 2021-11-24 DIAGNOSIS — J209 Acute bronchitis, unspecified: Secondary | ICD-10-CM

## 2021-11-24 MED ORDER — ALBUTEROL SULFATE HFA 108 (90 BASE) MCG/ACT IN AERS: 1.0000 | INHALATION_SPRAY | Freq: Four times a day (QID) | RESPIRATORY_TRACT | 0 refills | Status: AC | PRN

## 2021-11-24 MED ORDER — AMOXICILLIN-POT CLAVULANATE 875-125 MG PO TABS: 1.0000 | ORAL_TABLET | Freq: Two times a day (BID) | ORAL | 0 refills | Status: DC

## 2021-11-24 MED ORDER — DEXAMETHASONE SODIUM PHOSPHATE 10 MG/ML IJ SOLN
10.0000 mg | Freq: Once | INTRAMUSCULAR | Status: AC
  Administered 2021-11-24: 10 mg via INTRAMUSCULAR

## 2021-11-24 NOTE — ED Triage Notes (Signed)
Pt states she was here a few days ago and she was given cough meds but last night she started having a headache, cough with green mucus ? ?Pt states she tried nasal sprays and Nyquil Cold and Flu ? ?Denies Fever ? ? ?

## 2021-11-24 NOTE — ED Provider Notes (Signed)
?[place] [facility] ? ? ? ?CSN: [id number] ?Arrival date & time: 11/24/21  1438 ? ? ?  ? ?History   ?Chief Complaint ?Chief Complaint  ?Patient presents with  ? Cough  ?  And sinus pressure - Entered by patient  ? ? ?HPI ?Jasmine Kelly is a 66 y.o. female.  ? ?Presenting today with about a week of progressively worsening congestion, hacking cough, headache, fatigue now with facial pain and pressure, thick nasal congestion, wheezing, chest tightness, worsening cough.  Denies fever, chills, chest pain, shortness of breath, abdominal pain, nausea vomiting or diarrhea.  Has been taking the Phenergan DM cough syrup she was prescribed several days ago after being diagnosed with a viral upper respiratory infection as well as over-the-counter NyQuil, nasal sprays with minimal relief.  No known history of chronic pulmonary disease. ? ? ?Past Medical History:  ?Diagnosis Date  ? Depression   ? ? ?There are no problems to display for this patient. ? ? ?Past Surgical History:  ?Procedure Laterality Date  ? ANTERIOR AND POSTERIOR REPAIR  05/22/2011  ? Procedure: ANTERIOR (CYSTOCELE) AND POSTERIOR REPAIR (RECTOCELE);  Surgeon: Darlyn Chamber;  Location: [facility];  Service: Gynecology;  Laterality: N/A;  Sacrospinous ligament suspension  ? BLADDER SUSPENSION  05/22/2011  ? Procedure: TRANSVAGINAL TAPE (TVT) PROCEDURE;  Surgeon: Darlyn Chamber;  Location: [facility];  Service: Gynecology;  Laterality: N/A;  Mid urethral sling  ? CYSTOSCOPY  05/22/2011  ? Procedure: CYSTOSCOPY;  Surgeon: Darlyn Chamber;  Location: [facility];  Service: Gynecology;  Laterality: N/A;  ? DILATION AND CURETTAGE OF UTERUS    ? LAPAROSCOPIC ASSISTED VAGINAL HYSTERECTOMY  05/22/2011  ? Procedure: LAPAROSCOPIC ASSISTED VAGINAL HYSTERECTOMY;  Surgeon: Darlyn Chamber;  Location: [facility];  Service: Gynecology;  Laterality: Bilateral;  bilateral salpingo-oophorectomy  ? MASS EXCISION Left 06/26/2013  ? Procedure: MINOR EXCISION OF MASS left thumb foreign body removal ;   Surgeon: Tennis Must, MD;  Location: [facility];  Service: Orthopedics;  Laterality: Left;  ? ? ?OB History   ?No obstetric history on file. ?  ? ? ? ?Home Medications   ? ?Prior to Admission medications   ?Medication Sig Start Date End Date Taking? Authorizing Provider  ?albuterol (VENTOLIN HFA) 108 (90 Base) MCG/ACT inhaler Inhale 1-2 puffs into the lungs every 6 (six) hours as needed for wheezing or shortness of breath. 11/24/21  Yes Volney American, PA-C  ?amoxicillin-clavulanate (AUGMENTIN) 875-125 MG tablet Take 1 tablet by mouth 2 (two) times daily. X 7 days 11/24/21   Volney American, PA-C  ?Cholecalciferol (VITAMIN D) 10 MCG/ML LIQD Vitamin D    [provider]  ?fluticasone (FLONASE) 50 MCG/ACT nasal spray Place 2 sprays into both nostrils daily. 08/17/21   Melynda Ripple, MD  ?ibuprofen (ADVIL,MOTRIN) 200 MG tablet Take 400 mg by mouth as needed. Prn headache    [provider]  ?ipratropium (ATROVENT) 0.03 % nasal spray Place 2 sprays into both nostrils 2 (two) times daily. 07/29/20   Scot Jun, FNP  ?Mag Aspart-Potassium Aspart (POTASSIUM & MAGNESIUM ASPARTAT) 250-250 MG CAPS 1 capsule with a meal    [provider]  ?Magnesium 400 MG CAPS Take 2 capsules by mouth daily. otc    [provider]  ?Multiple Vitamin (MULTIVITAMIN) capsule Take 1 capsule by mouth daily.    [provider]  ?Omega-3 Fatty Acids (OMEGA-3 PO) Take 2 capsules by mouth daily. Pt takes OTC product called Omega XL  [provider]  ?PARoxetine (PAXIL) 20 MG tablet paroxetine 20 mg tablet ? TAKE 1 TABLET BY MOUTH EVERY DAY IN THE MORNING    [provider]  ?promethazine-dextromethorphan (PROMETHAZINE-DM) 6.25-15 MG/5ML syrup Take 5 mLs by mouth 4 (four) times daily as needed. 11/20/21   Volney American, PA-C  ? ? ?Family History ?No family history on file. ? ?Social History ?Social History  ? ?Tobacco Use  ? Smoking status:  Never  ?  Passive exposure: Never  ? Smokeless tobacco: Never  ?Vaping Use  ? Vaping Use: Never used  ?Substance Use Topics  ? Alcohol use: Yes  ?  Comment: occasionally  ? Drug use: No  ? ? ? ?Allergies   ?Patient has no known allergies. ? ? ?Review of Systems ?Review of Systems ?Per HPI ? ?Physical Exam ?Triage Vital Signs ?ED Triage Vitals  ?Enc Vitals Group  ?   BP 11/24/21 1514 111/71  ?   Pulse Rate 11/24/21 1514 66  ?   Resp 11/24/21 1514 18  ?   Temp 11/24/21 1514 98 ?F (36.7 ?C)  ?   Temp Source 11/24/21 1514 Oral  ?   SpO2 11/24/21 1514 92 %  ?   Weight --   ?   Height --   ?   Head Circumference --   ?   Peak Flow --   ?   Pain Score 11/24/21 1516 5  ?   Pain Loc --   ?   Pain Edu? --   ?   Excl. in [place]? --   ? ?No data found. ? ?Updated Vital Signs ?BP 111/71 (BP Location: Right Arm)   Pulse 66   Temp 98 ?F (36.7 ?C) (Oral)   Resp 18   SpO2 92%  ? ?Visual Acuity ?Right Eye Distance:   ?Left Eye Distance:   ?Bilateral Distance:   ? ?Right Eye Near:   ?Left Eye Near:    ?Bilateral Near:    ? ?Physical Exam ?Vitals and nursing note reviewed.  ?Constitutional:   ?   Appearance: Normal appearance.  ?HENT:  ?   Head: Atraumatic.  ?   Right Ear: Tympanic membrane and external ear normal.  ?   Left Ear: Tympanic membrane and external ear normal.  ?   Nose: Congestion present.  ?   Mouth/Throat:  ?   Mouth: Mucous membranes are moist.  ?   Pharynx: Posterior oropharyngeal erythema present.  ?Eyes:  ?   Extraocular Movements: Extraocular movements intact.  ?   Conjunctiva/sclera: Conjunctivae normal.  ?Cardiovascular:  ?   Rate and Rhythm: Normal rate and regular rhythm.  ?   Heart sounds: Normal heart sounds.  ?Pulmonary:  ?   Effort: Pulmonary effort is normal. No respiratory distress.  ?   Breath sounds: Wheezing present. No rales.  ?   Comments: Mild to moderate diffuse wheezes ?Musculoskeletal:     ?   General: Normal range of motion.  ?   Cervical back: Normal range of motion and neck supple.  ?Skin: ?    General: Skin is warm and dry.  ?Neurological:  ?   Mental Status: She is alert and oriented to person, place, and time.  ?   Motor: No weakness.  ?   Gait: Gait normal.  ?Psychiatric:     ?   Mood and Affect: Mood normal.     ?   Thought Content: Thought content normal.  ? ?UC Treatments / Results  ?Labs ?(  all labs ordered are listed, but only abnormal results are displayed) ?Labs Reviewed - No data to display ? ?EKG ? ?Radiology ?No results found. ? ?Procedures ?Procedures (including critical care time) ? ?Medications Ordered in UC ?Medications  ?dexamethasone (DECADRON) injection 10 mg (10 mg Intramuscular Given 11/24/21 1542)  ? ?Initial Impression / Assessment and Plan / UC Course  ?I have reviewed the triage vital signs and the nursing notes. ? ?Pertinent labs & imaging results that were available during my care of the patient were reviewed by me and considered in my medical decision making (see chart for details). ? ?  ? ?Worsening over the course of time, treat with amoxicillin, IM Decadron, albuterol as needed.  Discussed supportive over-the-counter medications and home care additionally.  Return for acutely worsening symptoms. ?Final Clinical Impressions(s) / UC Diagnoses  ? ?Final diagnoses:  ?Acute maxillary sinusitis, recurrence not specified  ?Acute bronchitis, unspecified organism  ? ?Discharge Instructions   ?None ?  ? ?ED Prescriptions   ? ? Medication Sig Dispense Auth. Provider  ? amoxicillin-clavulanate (AUGMENTIN) 875-125 MG tablet Take 1 tablet by mouth 2 (two) times daily. X 7 days 14 tablet Volney American, [place]  ? albuterol (VENTOLIN HFA) 108 (90 Base) MCG/ACT inhaler Inhale 1-2 puffs into the lungs every 6 (six) hours as needed for wheezing or shortness of breath. 18 g Volney American, [place]  ? ?  ? ?PDMP not reviewed this encounter. ?  ?Volney American, PA-C ?11/24/21 1600 ? ?

## 2021-12-14 ENCOUNTER — Other Ambulatory Visit: Payer: Self-pay | Admitting: Family Medicine

## 2021-12-15 NOTE — Telephone Encounter (Signed)
? ?  Notes to clinic: RF request sent to [facility] refill que- forwarded to prescribing provider ? ?Requested Prescriptions  ?Pending Prescriptions Disp Refills  ? albuterol (VENTOLIN HFA) 108 (90 Base) MCG/ACT inhaler [Pharmacy Med Name: ALBUTEROL HFA INH (200 PUFFS) 6.7GM] 6.7 g   ?  Sig: INHALE 1 TO 2 PUFFS INTO THE LUNGS EVERY 6 HOURS AS NEEDED FOR WHEEZING OR SHORTNESS OF BREATH  ?  ? There is no refill protocol information for this order  ?  ? ? ? ?Requested Prescriptions  ?Pending Prescriptions Disp Refills  ? albuterol (VENTOLIN HFA) 108 (90 Base) MCG/ACT inhaler [Pharmacy Med Name: ALBUTEROL HFA INH (200 PUFFS) 6.7GM] 6.7 g   ?  Sig: INHALE 1 TO 2 PUFFS INTO THE LUNGS EVERY 6 HOURS AS NEEDED FOR WHEEZING OR SHORTNESS OF BREATH  ?  ? There is no refill protocol information for this order  ?  ? ? ? ?

## 2022-01-18 ENCOUNTER — Ambulatory Visit
Admission: RE | Admit: 2022-01-18 | Discharge: 2022-01-18 | Disposition: A | Discharge status: Home / Self Care | Payer: 59 | Source: Ambulatory Visit | Attending: Family Medicine | Admitting: Family Medicine

## 2022-01-18 ENCOUNTER — Other Ambulatory Visit: Payer: Self-pay | Admitting: Family Medicine

## 2022-01-18 DIAGNOSIS — R052 Subacute cough: Secondary | ICD-10-CM

## 2022-01-19 ENCOUNTER — Other Ambulatory Visit: Payer: Self-pay | Admitting: Family Medicine

## 2022-01-19 DIAGNOSIS — R9389 Abnormal findings on diagnostic imaging of other specified body structures: Secondary | ICD-10-CM

## 2022-01-19 DIAGNOSIS — R052 Subacute cough: Secondary | ICD-10-CM

## 2022-02-07 ENCOUNTER — Ambulatory Visit
Admission: RE | Admit: 2022-02-07 | Discharge: 2022-02-07 | Disposition: A | Discharge status: Home / Self Care | Payer: 59 | Source: Ambulatory Visit | Attending: Family Medicine | Admitting: Family Medicine

## 2022-02-07 DIAGNOSIS — R052 Subacute cough: Secondary | ICD-10-CM

## 2022-02-07 DIAGNOSIS — R9389 Abnormal findings on diagnostic imaging of other specified body structures: Secondary | ICD-10-CM

## 2022-02-13 ENCOUNTER — Other Ambulatory Visit: Payer: 59

## 2022-03-15 ENCOUNTER — Encounter: Payer: Self-pay | Admitting: Pulmonary Disease

## 2022-03-15 ENCOUNTER — Ambulatory Visit: Payer: 59 | Admitting: Pulmonary Disease

## 2022-03-15 VITALS — BP 118/70 | HR 60 | Temp 97.5°F | Ht 65.0 in | Wt 166.0 lb

## 2022-03-15 DIAGNOSIS — J90 Pleural effusion, not elsewhere classified: Secondary | ICD-10-CM

## 2022-03-15 DIAGNOSIS — I517 Cardiomegaly: Secondary | ICD-10-CM

## 2022-03-15 NOTE — Patient Instructions (Signed)
Thank you for visiting Dr. Valeta Harms at [facility]. Today we recommend the following:  Orders Placed This Encounter  Procedures   Ambulatory referral to Cardiology    Return if symptoms worsen or fail to improve.    Please do your part to reduce the spread of COVID-19.

## 2022-03-15 NOTE — Progress Notes (Signed)
Synopsis: Referred in July 2023 for lung nodule by Arvella Nigh, MD  Subjective:   PATIENT ID: Jasmine Kelly GENDER: female DOB: 02/05/1956, MRN: [id number]  Chief Complaint  Patient presents with   Follow-up    She had a abnormal CXR and CT showed fluid and enlarged heart,     PatientThis is a 66 year old female who had a upper respiratory tract infection with a postviral cough that lasted for several weeks.  Was seen by primary care who ordered a chest x-ray.  It showed a small nodular density in the right lung which led to a CT scan of the chest.  CT scan of the chest revealed no evidence of a nodule however there was small trace bilateral pleural effusions and right atrial enlargement.  Otherwise no other significant findings.  Does better has no respiratory complaints today.    Past Medical History:  Diagnosis Date   Depression      Family History  Problem Relation Age of Onset   Cancer - Lung Mother    Lung cancer Father    Colon cancer Brother      Past Surgical History:  Procedure Laterality Date   ANTERIOR AND POSTERIOR REPAIR  05/22/2011   Procedure: ANTERIOR (CYSTOCELE) AND POSTERIOR REPAIR (RECTOCELE);  Surgeon: Darlyn Chamber;  Location: [facility];  Service: Gynecology;  Laterality: N/A;  Sacrospinous ligament suspension   BLADDER SUSPENSION  05/22/2011   Procedure: TRANSVAGINAL TAPE (TVT) PROCEDURE;  Surgeon: Darlyn Chamber;  Location: [facility];  Service: Gynecology;  Laterality: N/A;  Mid urethral sling   CYSTOSCOPY  05/22/2011   Procedure: CYSTOSCOPY;  Surgeon: Gwynn Burly McComb;  Location: [facility];  Service: Gynecology;  Laterality: N/A;   DILATION AND CURETTAGE OF UTERUS     LAPAROSCOPIC ASSISTED VAGINAL HYSTERECTOMY  05/22/2011   Procedure: LAPAROSCOPIC ASSISTED VAGINAL HYSTERECTOMY;  Surgeon: Darlyn Chamber;  Location: [facility];  Service: Gynecology;  Laterality: Bilateral;  bilateral salpingo-oophorectomy   MASS EXCISION Left 06/26/2013   Procedure: MINOR EXCISION OF MASS  left thumb foreign body removal ;  Surgeon: Tennis Must, MD;  Location: [facility];  Service: Orthopedics;  Laterality: Left;    Social History   Socioeconomic History   Marital status: Married    Spouse name: Not on file   Number of children: Not on file   Years of education: Not on file   Highest education level: Not on file  Occupational History   Not on file  Tobacco Use   Smoking status: Never    Passive exposure: Never   Smokeless tobacco: Never  Vaping Use   Vaping Use: Never used  Substance and Sexual Activity   Alcohol use: Yes    Comment: occasionally   Drug use: No   Sexual activity: Yes    Birth control/protection: None  Other Topics Concern   Not on file  Social History Narrative   Not on file   Social Determinants of Health   Financial Resource Strain: Not on file  Food Insecurity: Not on file  Transportation Needs: Not on file  Physical Activity: Not on file  Stress: Not on file  Social Connections: Not on file  Intimate Partner Violence: Not on file     No Known Allergies   Outpatient Medications Prior to Visit  Medication Sig Dispense Refill   albuterol (VENTOLIN HFA) 108 (90 Base) MCG/ACT inhaler Inhale 1-2 puffs into the lungs every 6 (six) hours as needed for wheezing or shortness  of breath. 18 g 0   Cholecalciferol (VITAMIN D) 10 MCG/ML LIQD Vitamin D     fluticasone (FLONASE) 50 MCG/ACT nasal spray Place 2 sprays into both nostrils daily. 16 g 0   ibuprofen (ADVIL,MOTRIN) 200 MG tablet Take 400 mg by mouth as needed. Prn headache     ipratropium (ATROVENT) 0.03 % nasal spray Place 2 sprays into both nostrils 2 (two) times daily. 30 mL 0   Mag Aspart-Potassium Aspart (POTASSIUM & MAGNESIUM ASPARTAT) 250-250 MG CAPS 1 capsule with a meal     Magnesium 400 MG CAPS Take 2 capsules by mouth daily. otc     Multiple Vitamin (MULTIVITAMIN) capsule Take 1 capsule by mouth daily.     Omega-3 Fatty Acids (OMEGA-3 PO) Take 2 capsules by  mouth daily. Pt takes OTC product called Omega XL     amoxicillin-clavulanate (AUGMENTIN) 875-125 MG tablet Take 1 tablet by mouth 2 (two) times daily. X 7 days 14 tablet 0   PARoxetine (PAXIL) 20 MG tablet paroxetine 20 mg tablet  TAKE 1 TABLET BY MOUTH EVERY DAY IN THE MORNING     venlafaxine XR (EFFEXOR-XR) 37.5 MG 24 hr capsule Take 37.5 mg by mouth daily.     promethazine-dextromethorphan (PROMETHAZINE-DM) 6.25-15 MG/5ML syrup Take 5 mLs by mouth 4 (four) times daily as needed. (Patient not taking: Reported on 03/15/2022) 100 mL 0   No facility-administered medications prior to visit.    Review of Systems  Constitutional:  Negative for chills, fever, malaise/fatigue and weight loss.  HENT:  Negative for hearing loss, sore throat and tinnitus.   Eyes:  Negative for blurred vision and double vision.  Respiratory:  Negative for cough, hemoptysis, sputum production, shortness of breath, wheezing and stridor.   Cardiovascular:  Negative for chest pain, palpitations, orthopnea, leg swelling and PND.  Gastrointestinal:  Negative for abdominal pain, constipation, diarrhea, heartburn, nausea and vomiting.  Genitourinary:  Negative for dysuria, hematuria and urgency.  Musculoskeletal:  Negative for joint pain and myalgias.  Skin:  Negative for itching and rash.  Neurological:  Negative for dizziness, tingling, weakness and headaches.  Endo/Heme/Allergies:  Negative for environmental allergies. Does not bruise/bleed easily.  Psychiatric/Behavioral:  Negative for depression. The patient is not nervous/anxious and does not have insomnia.   All other systems reviewed and are negative.    Objective:  Physical Exam Vitals reviewed.  Constitutional:      General: She is not in acute distress.    Appearance: She is well-developed.  HENT:     Head: Normocephalic and atraumatic.  Eyes:     General: No scleral icterus.    Conjunctiva/sclera: Conjunctivae normal.     Pupils: Pupils are equal,  round, and reactive to light.  Neck:     Vascular: No JVD.     Trachea: No tracheal deviation.  Cardiovascular:     Rate and Rhythm: Normal rate and regular rhythm.     Heart sounds: Murmur heard.  Pulmonary:     Effort: Pulmonary effort is normal. No tachypnea, accessory muscle usage or respiratory distress.     Breath sounds: No stridor. No wheezing, rhonchi or rales.  Abdominal:     General: There is no distension.     Palpations: Abdomen is soft.     Tenderness: There is no abdominal tenderness.  Musculoskeletal:        General: No tenderness.     Cervical back: Neck supple.  Lymphadenopathy:     Cervical: No cervical adenopathy.  Skin:  General: Skin is warm and dry.     Capillary Refill: Capillary refill takes less than 2 seconds.     Findings: No rash.  Neurological:     Mental Status: She is alert and oriented to person, place, and time.  Psychiatric:        Behavior: Behavior normal.      Vitals:   03/15/22 1112  BP: 118/70  Pulse: 60  Temp: (!) 97.5 F (36.4 C)  TempSrc: Other (Comment)  SpO2: 98%  Weight: 166 lb (75.3 kg)  Height: '5\' 5"'$  (1.651 m)   98% on RA BMI Readings from Last 3 Encounters:  03/15/22 27.62 kg/m  07/29/20 25.02 kg/m  05/22/11 24.86 kg/m   Wt Readings from Last 3 Encounters:  03/15/22 166 lb (75.3 kg)  07/29/20 155 lb (70.3 kg)  05/22/11 154 lb (69.9 kg)     CBC    Component Value Date/Time   WBC 10.9 (H) 05/23/2011 0537   RBC 3.63 (L) 05/23/2011 0537   HGB 10.8 (L) 05/23/2011 0537   HCT 33.2 (L) 05/23/2011 0537   PLT 226 05/23/2011 0537   MCV 91.5 05/23/2011 0537   MCH 29.8 05/23/2011 0537   MCHC 32.5 05/23/2011 0537   RDW 12.6 05/23/2011 0537     Chest Imaging:  CT chest July 2023: No lung nodule.  Trace small pleural effusion, right atrial enlargement. The patient's images have been independently reviewed by me.    Pulmonary Functions Testing Results:     No data to display          FeNO:    Pathology:   Echocardiogram:   Heart Catheterization:     Assessment & Plan:      ICD-10-CM   1. Pleural effusion  J90 Ambulatory referral to Cardiology    2. Atrial enlargement, right  I51.7 Ambulatory referral to Cardiology     Discussion: This is a 66 year old female that had a postviral cough with ongoing symptoms for several weeks had a chest x-ray with an incidental finding.  Led to a CT scan which showed resolution or absence of the lung nodule.  Other incidental findings were found including small trace pleural effusion she does have right atrial enlargement.  To me on exam she does have a faint systolic murmur.  Plan: I have placed referral to cardiology and reached out to Dr. Harrell Gave to let her know of the findings on the CT. Otherwise no additional follow-up needed for the incidental finding that was seen on chest x-ray. She will see [place] as needed in the future.   Current Outpatient Medications:    albuterol (VENTOLIN HFA) 108 (90 Base) MCG/ACT inhaler, Inhale 1-2 puffs into the lungs every 6 (six) hours as needed for wheezing or shortness of breath., Disp: 18 g, Rfl: 0   Cholecalciferol (VITAMIN D) 10 MCG/ML LIQD, Vitamin D, Disp: , Rfl:    fluticasone (FLONASE) 50 MCG/ACT nasal spray, Place 2 sprays into both nostrils daily., Disp: 16 g, Rfl: 0   ibuprofen (ADVIL,MOTRIN) 200 MG tablet, Take 400 mg by mouth as needed. Prn headache, Disp: , Rfl:    ipratropium (ATROVENT) 0.03 % nasal spray, Place 2 sprays into both nostrils 2 (two) times daily., Disp: 30 mL, Rfl: 0   Mag Aspart-Potassium Aspart (POTASSIUM & MAGNESIUM ASPARTAT) 250-250 MG CAPS, 1 capsule with a meal, Disp: , Rfl:    Magnesium 400 MG CAPS, Take 2 capsules by mouth daily. otc, Disp: , Rfl:    Multiple Vitamin (MULTIVITAMIN)  capsule, Take 1 capsule by mouth daily., Disp: , Rfl:    Omega-3 Fatty Acids (OMEGA-3 PO), Take 2 capsules by mouth daily. Pt takes OTC product called Omega XL, Disp: , Rfl:     venlafaxine XR (EFFEXOR-XR) 37.5 MG 24 hr capsule, Take 37.5 mg by mouth daily., Disp: , Rfl:    Garner Nash, DO [facility] 03/15/2022 12:29 PM

## 2022-04-04 ENCOUNTER — Encounter (HOSPITAL_BASED_OUTPATIENT_CLINIC_OR_DEPARTMENT_OTHER): Payer: Self-pay

## 2022-04-04 DIAGNOSIS — F32A Depression, unspecified: Secondary | ICD-10-CM | POA: Insufficient documentation

## 2022-04-10 ENCOUNTER — Ambulatory Visit (HOSPITAL_BASED_OUTPATIENT_CLINIC_OR_DEPARTMENT_OTHER): Payer: 59 | Admitting: Cardiology

## 2022-04-10 ENCOUNTER — Encounter (HOSPITAL_BASED_OUTPATIENT_CLINIC_OR_DEPARTMENT_OTHER): Payer: Self-pay | Admitting: Cardiology

## 2022-04-10 VITALS — BP 104/70 | HR 58 | Ht 65.0 in | Wt 167.5 lb

## 2022-04-10 DIAGNOSIS — R9431 Abnormal electrocardiogram [ECG] [EKG]: Secondary | ICD-10-CM

## 2022-04-10 DIAGNOSIS — Z7189 Other specified counseling: Secondary | ICD-10-CM | POA: Diagnosis not present

## 2022-04-10 DIAGNOSIS — E7800 Pure hypercholesterolemia, unspecified: Secondary | ICD-10-CM | POA: Diagnosis not present

## 2022-04-10 DIAGNOSIS — I517 Cardiomegaly: Secondary | ICD-10-CM | POA: Diagnosis not present

## 2022-04-10 DIAGNOSIS — R011 Cardiac murmur, unspecified: Secondary | ICD-10-CM

## 2022-04-10 NOTE — Progress Notes (Signed)
Cardiology Office Note:    Date:  04/10/2022   ID:  Jasmine Kelly, DOB [birth year], MRN [id number]  PCP:  Jasmine Nigh, MD  Cardiologist:  Jasmine Dresser, MD  Referring MD: Jasmine Nash, DO   CC: new patient consultation for right atrial enlargement, murmur, pleural effusion  History of Present Illness:    Jasmine Kelly is a 66 y.o. female with a hx of depression, who is seen as a new consult at the request of Icard, Octavio Graves, DO for the evaluation and management of right atrial enlargement.  She saw Dr. Valeta Kelly on 03/15/2022. A recent CT scan of the chest revealed no evidence of a nodule; however there was small trace bilateral pleural effusions and right atrial enlargement. On exam, she was also noted to have a faint systolic murmur. She was referred to cardiology for further evaluation.  Cardiovascular risk factors: Prior clinical ASCVD: None. Comorbid conditions: She has been told previously that her cholesterol was a little high. She is managing this with diet and has not needed medication.  Metabolic syndrome/Obesity: Highest adult weight is her current weight, 167 lbs. Chronic inflammatory conditions: None Tobacco use history: Never. Family history: She denies any family history of cardiovascular disease. Prior cardiac testing and/or incidental findings on other testing (ie coronary calcium): Chest CT showed right atrial enlargement. Exercise level: Retired 8 years ago. She walks 4-5 times a week, at least 2-3 miles. For a time she was walking 2 miles in the morning and night. She does not feel physically limited. Current diet: Usually meat and vegetables; fruits, salads. In the past 2 weeks she had been on a cruise to [place], gained about 2 lbs.    She denies any palpitations, chest pain, shortness of breath, or peripheral edema. No lightheadedness, headaches, syncope, orthopnea, or PND.  Past Medical History:  Diagnosis Date   Depression     Past Surgical  History:  Procedure Laterality Date   ANTERIOR AND POSTERIOR REPAIR  05/22/2011   Procedure: ANTERIOR (CYSTOCELE) AND POSTERIOR REPAIR (RECTOCELE);  Surgeon: Darlyn Chamber;  Location: [facility];  Service: Gynecology;  Laterality: N/A;  Sacrospinous ligament suspension   BLADDER SUSPENSION  05/22/2011   Procedure: TRANSVAGINAL TAPE (TVT) PROCEDURE;  Surgeon: Darlyn Chamber;  Location: [facility];  Service: Gynecology;  Laterality: N/A;  Mid urethral sling   CYSTOSCOPY  05/22/2011   Procedure: CYSTOSCOPY;  Surgeon: Jasmine Kelly;  Location: [facility];  Service: Gynecology;  Laterality: N/A;   DILATION AND CURETTAGE OF UTERUS     LAPAROSCOPIC ASSISTED VAGINAL HYSTERECTOMY  05/22/2011   Procedure: LAPAROSCOPIC ASSISTED VAGINAL HYSTERECTOMY;  Surgeon: Darlyn Chamber;  Location: [facility];  Service: Gynecology;  Laterality: Bilateral;  bilateral salpingo-oophorectomy   MASS EXCISION Left 06/26/2013   Procedure: MINOR EXCISION OF MASS left thumb foreign body removal ;  Surgeon: Jasmine Must, MD;  Location: [facility];  Service: Orthopedics;  Laterality: Left;    Current Medications: Current Outpatient Medications on File Prior to Visit  Medication Sig   albuterol (VENTOLIN HFA) 108 (90 Base) MCG/ACT inhaler Inhale 1-2 puffs into the lungs every 6 (six) hours as needed for wheezing or shortness of breath.   Cholecalciferol (VITAMIN D) 10 MCG/ML LIQD Vitamin D   fluticasone (FLONASE) 50 MCG/ACT nasal spray Place 2 sprays into both nostrils daily.   ibuprofen (ADVIL,MOTRIN) 200 MG tablet Take 400 mg by mouth as needed. Prn headache   ipratropium (ATROVENT) 0.03 % nasal spray Place 2  sprays into both nostrils 2 (two) times daily.   Mag Aspart-Potassium Aspart (POTASSIUM & MAGNESIUM ASPARTAT) 250-250 MG CAPS 1 capsule with a meal   Magnesium 400 MG CAPS Take 2 capsules by mouth daily. otc   Multiple Vitamin (MULTIVITAMIN) capsule Take 1 capsule by mouth daily.   Omega-3 Fatty Acids (OMEGA-3 PO) Take 2  capsules by mouth daily. Pt takes OTC product called Omega XL   venlafaxine XR (EFFEXOR-XR) 37.5 MG 24 hr capsule Take 37.5 mg by mouth daily.   No current facility-administered medications on file prior to visit.     Allergies:   Patient has no known allergies.   Social History   Tobacco Use   Smoking status: Never    Passive exposure: Never   Smokeless tobacco: Never  Vaping Use   Vaping Use: Never used  Substance Use Topics   Alcohol use: Yes    Comment: occasionally   Drug use: No    Family History: family history includes Cancer - Lung in her mother; Colon cancer in her brother; Lung cancer in her father.  ROS:   Please see the history of present illness.  Additional pertinent ROS: Constitutional: Negative for chills, fever, night sweats, unintentional weight loss  HENT: Negative for ear pain and hearing loss.   Eyes: Negative for loss of vision and eye pain.  Respiratory: Negative for cough, sputum, wheezing.   Cardiovascular: See HPI. Gastrointestinal: Negative for abdominal pain, melena, and hematochezia.  Genitourinary: Negative for dysuria and hematuria.  Musculoskeletal: Negative for falls and myalgias.  Skin: Negative for itching and rash.  Neurological: Negative for focal weakness, focal sensory changes and loss of consciousness.  Endo/Heme/Allergies: Does not bruise/bleed easily.     EKGs/Labs/Other Studies Reviewed:    The following studies were reviewed today:  CT Chest  02/07/2022: FINDINGS: Cardiovascular: Prominent right atrium. No significant pericardial effusion. The thoracic aorta is normal in caliber. No atherosclerotic plaque of the thoracic aorta. No coronary artery calcifications.   Mediastinum/Nodes: No gross hilar adenopathy, noting limited sensitivity for the detection of hilar adenopathy on this noncontrast study. No enlarged mediastinal or axillary lymph nodes. Thyroid gland, trachea, and esophagus demonstrate no  significant findings.   Lungs/Pleura: No focal consolidation. No pulmonary nodule. No pulmonary mass. Persistent bilateral trace pleural effusions. No pneumothorax.   Upper Abdomen: No acute abnormality.   Musculoskeletal:   No chest wall abnormality.   No suspicious lytic or blastic osseous lesions. No acute displaced fracture. Multilevel degenerative changes of the spine.   IMPRESSION: 1. Persistent nonspecific trace bilateral pleural effusions. 2. Mild cardiomegaly. 3. No correlation with radiographic finding (x-ray 01/18/2022).   EKG:  EKG is personally reviewed.   04/10/2022:  sinus bradycardia with sinus arrhythmia at 58 bpm, iRBBB, nonspecific t wave pattern  Recent Labs: No results found for requested labs within last 365 days.   Recent Lipid Panel No results found for: "CHOL", "TRIG", "HDL", "CHOLHDL", "VLDL", "LDLCALC", "LDLDIRECT"  Physical Exam:    VS:  BP 104/70 (BP Location: Left Arm, Patient Position: Sitting, Cuff Size: Large)   Pulse (!) 58   Ht '5\' 5"'$  (1.651 m)   Wt 167 lb 8 oz (76 kg)   BMI 27.87 kg/m     Wt Readings from Last 3 Encounters:  04/10/22 167 lb 8 oz (76 kg)  03/15/22 166 lb (75.3 kg)  07/29/20 155 lb (70.3 kg)    GEN: Well nourished, well developed in no acute distress HEENT: Normal, moist mucous membranes NECK: No  JVD CARDIAC: regular rhythm, normal S1 and S2, no rubs or gallops. 1/6 soft systolic murmur. VASCULAR: Radial and DP pulses 2+ bilaterally. No carotid bruits RESPIRATORY:  Clear to auscultation without rales, wheezing or rhonchi  ABDOMEN: Soft, non-tender, non-distended MUSCULOSKELETAL:  Ambulates independently SKIN: Warm and dry, no edema NEUROLOGIC:  Alert and oriented x 3. No focal neuro deficits noted. PSYCHIATRIC:  Normal affect    ASSESSMENT:    1. Abnormal ECG   2. Right atrial enlargement   3. Pure hypercholesterolemia   4. Cardiac risk counseling   5. Counseling on health promotion and disease prevention    6. Murmur, cardiac    PLAN:    Abnormal ECG Right atrial enlargement Murmur -echocardiogram -if unremarkable, would re-image in the future if she develops symptoms  Hypercholesterolemia Cardiac risk counseling and prevention recommendations: -recommend heart healthy/Mediterranean diet, with whole grains, fruits, vegetable, fish, lean meats, nuts, and olive oil. Limit salt. -recommend moderate walking, 3-5 times/week for 30-50 minutes each session. Aim for at least 150 minutes.week. Goal should be pace of 3 miles/hours, or walking 1.5 miles in 30 minutes -recommend avoidance of tobacco products. Avoid excess alcohol. -ASCVD risk score: The ASCVD Risk score (Arnett DK, et al., 2019) failed to calculate for the following reasons:   Cannot find a previous HDL lab   Cannot find a previous total cholesterol lab    Plan for follow up: I would be happy to see her back as needed if her echo is unremarkable  Jasmine Dresser, MD, PhD, [place]    Medication Adjustments/Labs and Tests Ordered: Current medicines are reviewed at length with the patient today.  Concerns regarding medicines are outlined above.   Orders Placed This Encounter  Procedures   EKG 12-Lead   ECHOCARDIOGRAM COMPLETE   No orders of the defined types were placed in this encounter.  Patient Instructions  Medication Instructions:  Your Physician recommend you continue on your current medication as directed.    *If you need a refill on your cardiac medications before your next appointment, please call your pharmacy*   Lab Work: None ordered today   Testing/Procedures: Your physician has requested that you have an echocardiogram. Echocardiography is a painless test that uses sound waves to create images of your heart. It provides your doctor with information about the size and shape of your heart and how well your heart's chambers and valves are working. This procedure takes  approximately one hour. There are no restrictions for this procedure. [place], you and your health needs are our priority.  As part of our continuing mission to provide you with exceptional heart care, we have created designated Provider Care Teams.  These Care Teams include your primary Cardiologist (physician) and Advanced Practice Providers (APPs -  Physician Assistants and Nurse Practitioners) who all work together to provide you with the care you need, when you need it.  We recommend signing up for the patient portal called "MyChart".  Sign up information is provided on this After Visit Summary.  MyChart is used to connect with patients for Virtual Visits (Telemedicine).  Patients are able to view lab/test results, encounter notes, upcoming appointments, etc.  Non-urgent messages can be sent to your provider as well.   To learn more about what you can do with MyChart, go to [email or web address].    Your next appointment:   As needed  The format for your next appointment:  In Person  Provider:   Buford Dresser, MD{          I,Mathew Stumpf,acting as a scribe for Jasmine Dresser, MD.,have documented all relevant documentation on the behalf of Jasmine Dresser, MD,as directed by  Jasmine Dresser, MD while in the presence of Jasmine Dresser, MD.  I, Jasmine Dresser, MD, have reviewed all documentation for this visit. The documentation on 05/13/22 for the exam, diagnosis, procedures, and orders are all accurate and complete.   Signed, Jasmine Dresser, MD PhD 04/10/2022     [place]

## 2022-04-10 NOTE — Patient Instructions (Signed)
Medication Instructions:  °Your Physician recommend you continue on your current medication as directed.   ° °*If you need a refill on your cardiac medications before your next appointment, please call your pharmacy* ° ° °Lab Work: °None ordered today ° ° °Testing/Procedures: °Your physician has requested that you have an echocardiogram. Echocardiography is a painless test that uses sound waves to create images of your heart. It provides your doctor with information about the size and shape of your heart and how well your heart’s chambers and valves are working. This procedure takes approximately one hour. There are no restrictions for this procedure. °[address] ° ° ° °Follow-Up: °At [facility], you and your health needs are our priority.  As part of our continuing mission to provide you with exceptional heart care, we have created designated Provider Care Teams.  These Care Teams include your primary Cardiologist (physician) and Advanced Practice Providers (APPs -  Physician Assistants and Nurse Practitioners) who all work together to provide you with the care you need, when you need it. ° °We recommend signing up for the patient portal called "MyChart".  Sign up information is provided on this After Visit Summary.  MyChart is used to connect with patients for Virtual Visits (Telemedicine).  Patients are able to view lab/test results, encounter notes, upcoming appointments, etc.  Non-urgent messages can be sent to your provider as well.   °To learn more about what you can do with MyChart, go to [email or web address].   ° °Your next appointment:   °As needed ° °The format for your next appointment:   °In Person ° °Provider:   °Bridgette Christopher, MD  ° ° °

## 2022-04-25 ENCOUNTER — Ambulatory Visit (INDEPENDENT_AMBULATORY_CARE_PROVIDER_SITE_OTHER): Payer: 59

## 2022-04-25 DIAGNOSIS — R9431 Abnormal electrocardiogram [ECG] [EKG]: Secondary | ICD-10-CM | POA: Diagnosis not present

## 2022-04-25 DIAGNOSIS — I517 Cardiomegaly: Secondary | ICD-10-CM

## 2022-04-25 DIAGNOSIS — R011 Cardiac murmur, unspecified: Secondary | ICD-10-CM | POA: Diagnosis not present

## 2022-04-25 LAB — ECHOCARDIOGRAM COMPLETE
Area-P 1/2: 4.71 cm2
MV M vel: 4.23 m/s
MV Peak grad: 71.6 mmHg
P 1/2 time: 461 msec
Radius: 0.8 cm
S' Lateral: 3.02 cm

## 2022-05-07 ENCOUNTER — Encounter (HOSPITAL_BASED_OUTPATIENT_CLINIC_OR_DEPARTMENT_OTHER): Payer: Self-pay

## 2022-06-22 ENCOUNTER — Telehealth: Payer: Self-pay

## 2022-06-22 NOTE — Telephone Encounter (Signed)
   Pre-operative Risk Assessment    Patient Name: Jasmine Kelly  DOB: 1956-03-16 MRN: [id number]     Request for Surgical Clearancec    Procedure:  Colonoscopy   Date of Surgery:  Clearance 08/10/22                                 Surgeon:  Dr. Wilford Corner  Surgeon's Group or Practice Name:  [facility]  Phone number:  [phone] Fax number:  [phone]   Type of Clearance Requested:   - Medical    Type of Anesthesia:  Not Indicated   Additional requests/questions:    SignedMendel Ryder   06/22/2022, 7:50 AM

## 2022-06-22 NOTE — Telephone Encounter (Signed)
Pt is scheduled for a tele visit on 06/28/22. Med rec and consent done

## 2022-06-22 NOTE — Telephone Encounter (Signed)
  Patient Consent for Virtual Visit         Jasmine Kelly has provided verbal consent on 06/22/2022 for a virtual visit (video or telephone).   CONSENT FOR VIRTUAL VISIT FOR:  [name]  By participating in this virtual visit I agree to the following:  I hereby voluntarily request, consent and authorize [facility] and its employed or contracted physicians, physician assistants, nurse practitioners or other licensed health care professionals (the Practitioner), to provide me with telemedicine health care services (the "Services") as deemed necessary by the treating Practitioner. I acknowledge and consent to receive the Services by the Practitioner via telemedicine. I understand that the telemedicine visit will involve communicating with the Practitioner through live audiovisual communication technology and the disclosure of certain medical information by electronic transmission. I acknowledge that I have been given the opportunity to request an in-person assessment or other available alternative prior to the telemedicine visit and am voluntarily participating in the telemedicine visit.  I understand that I have the right to withhold or withdraw my consent to the use of telemedicine in the course of my care at any time, without affecting my right to future care or treatment, and that the Practitioner or I may terminate the telemedicine visit at any time. I understand that I have the right to inspect all information obtained and/or recorded in the course of the telemedicine visit and may receive copies of available information for a reasonable fee.  I understand that some of the potential risks of receiving the Services via telemedicine include:  Delay or interruption in medical evaluation due to technological equipment failure or disruption; Information transmitted may not be sufficient (e.g. poor resolution of images) to allow for appropriate medical decision making by the  Practitioner; and/or  In rare instances, security protocols could fail, causing a breach of personal health information.  Furthermore, I acknowledge that it is my responsibility to provide information about my medical history, conditions and care that is complete and accurate to the best of my ability. I acknowledge that Practitioner's advice, recommendations, and/or decision may be based on factors not within their control, such as incomplete or inaccurate data provided by me or distortions of diagnostic images or specimens that may result from electronic transmissions. I understand that the practice of medicine is not an exact science and that Practitioner makes no warranties or guarantees regarding treatment outcomes. I acknowledge that a copy of this consent can be made available to me via my patient portal ([facility]), or I can request a printed copy by calling the office of [facility].    I understand that my insurance will be billed for this visit.   I have read or had this consent read to me. I understand the contents of this consent, which adequately explains the benefits and risks of the Services being provided via telemedicine.  I have been provided ample opportunity to ask questions regarding this consent and the Services and have had my questions answered to my satisfaction. I give my informed consent for the services to be provided through the use of telemedicine in my medical care

## 2022-06-22 NOTE — Telephone Encounter (Signed)
   Name: Jasmine Kelly  DOB: 07/19/1956  MRN: [id number]  Primary Cardiologist: Buford Dresser, MD   Preoperative team, please contact this patient and set up a phone call appointment for further preoperative risk assessment. Please obtain consent and complete medication review. Thank you for your help.  I confirm that guidance regarding antiplatelet and oral anticoagulation therapy has been completed and, if necessary, noted below (none requested).   Lenna Sciara, NP 06/22/2022, 11:10 AM [place]

## 2022-06-27 NOTE — Progress Notes (Unsigned)
Virtual Visit via Telephone Note   Because of Jasmine Kelly's co-morbid illnesses, she is at least at moderate risk for complications without adequate follow up.  This format is felt to be most appropriate for this patient at this time.  The patient did not have access to video technology/had technical difficulties with video requiring transitioning to audio format only (telephone).  All issues noted in this document were discussed and addressed.  No physical exam could be performed with this format.  Please refer to the patient's chart for her consent to telehealth for [facility].  Evaluation Performed:  Preoperative cardiovascular risk assessment _____________   Date:  06/27/2022   Patient ID:  Jasmine Kelly, DOB [birth year], MRN [id number] Patient Location:  Home Provider location:   Office  Primary Care Provider:  Jonathon Jordan, MD Primary Cardiologist:  Buford Dresser, MD  Chief Complaint / Patient Profile   66 y.o. y/o female with a h/o referred to cardiology and seen by Dr. Harrell Gave on 03/21/2022 for right atrial enlargement on CT 02/2022 and for evaluation of murmur who is pending colonoscopy and presents today for telephonic preoperative cardiovascular risk assessment.  Past Medical History    Past Medical History:  Diagnosis Date   Depression    Past Surgical History:  Procedure Laterality Date   ANTERIOR AND POSTERIOR REPAIR  05/22/2011   Procedure: ANTERIOR (CYSTOCELE) AND POSTERIOR REPAIR (RECTOCELE);  Surgeon: Darlyn Chamber;  Location: [facility];  Service: Gynecology;  Laterality: N/A;  Sacrospinous ligament suspension   BLADDER SUSPENSION  05/22/2011   Procedure: TRANSVAGINAL TAPE (TVT) PROCEDURE;  Surgeon: Darlyn Chamber;  Location: [facility];  Service: Gynecology;  Laterality: N/A;  Mid urethral sling   CYSTOSCOPY  05/22/2011   Procedure: CYSTOSCOPY;  Surgeon: Gwynn Burly McComb;  Location: [facility];  Service: Gynecology;  Laterality: N/A;   DILATION AND  CURETTAGE OF UTERUS     LAPAROSCOPIC ASSISTED VAGINAL HYSTERECTOMY  05/22/2011   Procedure: LAPAROSCOPIC ASSISTED VAGINAL HYSTERECTOMY;  Surgeon: Darlyn Chamber;  Location: [facility];  Service: Gynecology;  Laterality: Bilateral;  bilateral salpingo-oophorectomy   MASS EXCISION Left 06/26/2013   Procedure: MINOR EXCISION OF MASS left thumb foreign body removal ;  Surgeon: Tennis Must, MD;  Location: [facility];  Service: Orthopedics;  Laterality: Left;    Allergies  No Known Allergies  History of Present Illness    Jasmine Kelly is a 65 y.o. female who presents via audio/video conferencing for a telehealth visit today.  Pt was last seen in cardiology clinic on 04/10/2022 by Dr. Harrell Gave.  At that time Jasmine Kelly was doing well.  She had 2D echo 04/25/2022 which revealed no high risk findings, normal LVEF, grade 1 diastolic dysfunction, mild biatrial dilatation, mild MR the patient is now pending procedure as outlined above. Since her last visit, she *** denies chest pain, shortness of breath, lower extremity edema, fatigue, palpitations, melena, hematuria, hemoptysis, diaphoresis, weakness, presyncope, syncope, orthopnea, and PND.    Home Medications    Prior to Admission medications   Medication Sig Start Date End Date Taking? Authorizing Provider  albuterol (VENTOLIN HFA) 108 (90 Base) MCG/ACT inhaler Inhale 1-2 puffs into the lungs every 6 (six) hours as needed for wheezing or shortness of breath. 11/24/21   Volney American, PA-C  Cholecalciferol (VITAMIN D) 10 MCG/ML LIQD Vitamin D    [provider]  fluticasone (FLONASE) 50 MCG/ACT nasal spray Place 2 sprays into both nostrils daily.  08/17/21   Melynda Ripple, MD  ibuprofen (ADVIL,MOTRIN) 200 MG tablet Take 400 mg by mouth as needed. Prn headache    [provider]  ipratropium (ATROVENT) 0.03 % nasal spray Place 2 sprays into both nostrils 2 (two) times daily. 07/29/20   Scot Jun,  FNP  Multiple Vitamin (MULTIVITAMIN) capsule Take 1 capsule by mouth daily.    [provider]  Omega-3 Fatty Acids (OMEGA-3 PO) Take 2 capsules by mouth daily. Pt takes OTC product called Omega XL    [provider]  venlafaxine XR (EFFEXOR-XR) 37.5 MG 24 hr capsule Take 37.5 mg by mouth daily. 03/13/22   [provider]    Physical Exam    Vital Signs:  Jasmine Kelly does not have vital signs available for review today.***  Given telephonic nature of communication, physical exam is limited. AAOx3. NAD. Normal affect.  Speech and respirations are unlabored.  Accessory Clinical Findings    None  Assessment & Plan    1.  Preoperative Cardiovascular Risk Assessment:  The patient was advised that if she develops new symptoms prior to surgery to contact our office to arrange for a follow-up visit, and she verbalized understanding.  No cardiac medications need to be held.   A copy of this note will be routed to requesting surgeon.  Time:   Today, I have spent *** minutes with the patient with telehealth technology discussing medical history, symptoms, and management plan.     Emmaline Life, NP  06/27/2022, 10:45 AM

## 2022-06-28 ENCOUNTER — Ambulatory Visit: Payer: 59 | Attending: Internal Medicine | Admitting: Nurse Practitioner

## 2022-06-28 ENCOUNTER — Encounter: Payer: Self-pay | Admitting: Nurse Practitioner

## 2022-06-28 DIAGNOSIS — Z01810 Encounter for preprocedural cardiovascular examination: Secondary | ICD-10-CM

## 2022-10-08 ENCOUNTER — Ambulatory Visit
Admission: EM | Admit: 2022-10-08 | Discharge: 2022-10-08 | Disposition: A | Discharge status: Home / Self Care | Payer: 59 | Attending: Family Medicine | Admitting: Family Medicine

## 2022-10-08 DIAGNOSIS — Z1152 Encounter for screening for COVID-19: Secondary | ICD-10-CM | POA: Insufficient documentation

## 2022-10-08 DIAGNOSIS — R059 Cough, unspecified: Secondary | ICD-10-CM | POA: Insufficient documentation

## 2022-10-08 DIAGNOSIS — J069 Acute upper respiratory infection, unspecified: Secondary | ICD-10-CM | POA: Diagnosis present

## 2022-10-08 LAB — POCT INFLUENZA A/B
Influenza A, POC: NEGATIVE
Influenza B, POC: NEGATIVE

## 2022-10-08 MED ORDER — PREDNISONE 50 MG PO TABS: ORAL_TABLET | ORAL | 0 refills | Status: AC

## 2022-10-08 NOTE — Discharge Instructions (Signed)
Take medications such as DayQuil, NyQuil, Flonase twice daily, use sinus rinses and humidifiers.  I have also sent over prednisone to help reduce her symptoms further.  Your COVID test should be back tomorrow.  Your flu test was negative.

## 2022-10-08 NOTE — ED Provider Notes (Signed)
[facility]    CSN: [id number] Arrival date & time: 10/08/22  0827      History   Chief Complaint Chief Complaint  Patient presents with   Headache    Pressure behind my eyes - Entered by patient    HPI Jasmine Kelly is a 67 y.o. female.   Patient presenting today with 4-day history of fatigue, body aches, chills, congestion, sinus pain and pressure, headache, mild cough.  Denies fever, chest pain, shortness of breath, abdominal pain, nausea vomiting or diarrhea.  So far trying decongestants and Tylenol with minimal relief.  Numerous sick contacts recently.    Past Medical History:  Diagnosis Date   Depression     Patient Active Problem List   Diagnosis Date Noted   Depression 04/04/2022    Past Surgical History:  Procedure Laterality Date   ANTERIOR AND POSTERIOR REPAIR  05/22/2011   Procedure: ANTERIOR (CYSTOCELE) AND POSTERIOR REPAIR (RECTOCELE);  Surgeon: Darlyn Chamber;  Location: [facility];  Service: Gynecology;  Laterality: N/A;  Sacrospinous ligament suspension   BLADDER SUSPENSION  05/22/2011   Procedure: TRANSVAGINAL TAPE (TVT) PROCEDURE;  Surgeon: Darlyn Chamber;  Location: [facility];  Service: Gynecology;  Laterality: N/A;  Mid urethral sling   CYSTOSCOPY  05/22/2011   Procedure: CYSTOSCOPY;  Surgeon: Gwynn Burly McComb;  Location: [facility];  Service: Gynecology;  Laterality: N/A;   DILATION AND CURETTAGE OF UTERUS     LAPAROSCOPIC ASSISTED VAGINAL HYSTERECTOMY  05/22/2011   Procedure: LAPAROSCOPIC ASSISTED VAGINAL HYSTERECTOMY;  Surgeon: Darlyn Chamber;  Location: [facility];  Service: Gynecology;  Laterality: Bilateral;  bilateral salpingo-oophorectomy   MASS EXCISION Left 06/26/2013   Procedure: MINOR EXCISION OF MASS left thumb foreign body removal ;  Surgeon: Tennis Must, MD;  Location: [facility];  Service: Orthopedics;  Laterality: Left;    OB History   No obstetric history on file.      Home Medications    Prior to Admission  medications   Medication Sig Start Date End Date Taking? Authorizing Provider  predniSONE (DELTASONE) 50 MG tablet Take 1 tab daily with food for 3 days 10/08/22  Yes Volney American, PA-C  albuterol (VENTOLIN HFA) 108 (90 Base) MCG/ACT inhaler Inhale 1-2 puffs into the lungs every 6 (six) hours as needed for wheezing or shortness of breath. 11/24/21   Volney American, PA-C  Cholecalciferol (VITAMIN D) 10 MCG/ML LIQD Vitamin D    [provider]  fluticasone (FLONASE) 50 MCG/ACT nasal spray Place 2 sprays into both nostrils daily. 08/17/21   Melynda Ripple, MD  ibuprofen (ADVIL,MOTRIN) 200 MG tablet Take 400 mg by mouth as needed. Prn headache    [provider]  ipratropium (ATROVENT) 0.03 % nasal spray Place 2 sprays into both nostrils 2 (two) times daily. 07/29/20   Scot Jun, NP  Multiple Vitamin (MULTIVITAMIN) capsule Take 1 capsule by mouth daily.    [provider]  Omega-3 Fatty Acids (OMEGA-3 PO) Take 2 capsules by mouth daily. Pt takes OTC product called Omega XL    [provider]  venlafaxine XR (EFFEXOR-XR) 37.5 MG 24 hr capsule Take 37.5 mg by mouth daily. 03/13/22   [provider]    Family History Family History  Problem Relation Age of Onset   Cancer - Lung Mother    Lung cancer Father    Colon cancer Brother     Social History Social History   Tobacco Use   Smoking  status: Never    Passive exposure: Never   Smokeless tobacco: Never  Vaping Use   Vaping Use: Never used  Substance Use Topics   Alcohol use: Yes    Comment: occasionally   Drug use: No     Allergies   Patient has no known allergies.   Review of Systems Review of Systems Per HPI  Physical Exam Triage Vital Signs ED Triage Vitals  Enc Vitals Group     BP 10/08/22 0939 107/72     Pulse Rate 10/08/22 0939 75     Resp 10/08/22 0939 20     Temp 10/08/22 0939 98.3 F (36.8 C)     Temp Source 10/08/22 0939 Oral     SpO2  10/08/22 0939 97 %     Weight --      Height --      Head Circumference --      Peak Flow --      Pain Score 10/08/22 0941 6     Pain Loc --      Pain Edu? --      Excl. in [place]? --    No data found.  Updated Vital Signs BP 107/72 (BP Location: Right Arm)   Pulse 75   Temp 98.3 F (36.8 C) (Oral)   Resp 20   SpO2 97%   Visual Acuity Right Eye Distance:   Left Eye Distance:   Bilateral Distance:    Right Eye Near:   Left Eye Near:    Bilateral Near:     Physical Exam Vitals and nursing note reviewed.  Constitutional:      Appearance: Normal appearance.  HENT:     Head: Atraumatic.     Right Ear: Tympanic membrane and external ear normal.     Left Ear: Tympanic membrane and external ear normal.     Nose: Rhinorrhea present.     Mouth/Throat:     Mouth: Mucous membranes are moist.     Pharynx: Posterior oropharyngeal erythema present.  Eyes:     Extraocular Movements: Extraocular movements intact.     Conjunctiva/sclera: Conjunctivae normal.  Cardiovascular:     Rate and Rhythm: Normal rate and regular rhythm.     Heart sounds: Normal heart sounds.  Pulmonary:     Effort: Pulmonary effort is normal.     Breath sounds: Normal breath sounds. No wheezing.  Musculoskeletal:        General: Normal range of motion.     Cervical back: Normal range of motion and neck supple.  Skin:    General: Skin is warm and dry.  Neurological:     Mental Status: She is alert and oriented to person, place, and time.  Psychiatric:        Mood and Affect: Mood normal.        Thought Content: Thought content normal.      UC Treatments / Results  Labs (all labs ordered are listed, but only abnormal results are displayed) Labs Reviewed  SARS CORONAVIRUS 2 (TAT 6-24 HRS)  POCT INFLUENZA A/B    EKG   Radiology No results found.  Procedures Procedures (including critical care time)  Medications Ordered in UC Medications - No data to display  Initial Impression /  Assessment and Plan / UC Course  I have reviewed the triage vital signs and the nursing notes.  Pertinent labs & imaging results that were available during my care of the patient were reviewed by me and considered in my medical  decision making (see chart for details).     Vitals and exam overall reassuring and suggestive of a viral upper respiratory infection.  Flu testing negative, COVID test pending.  Treat with very short course of prednisone, Flonase, cold and congestion medications.  Return for worsening symptoms. Final Clinical Impressions(s) / UC Diagnoses   Final diagnoses:  Viral URI with cough     Discharge Instructions      Take medications such as DayQuil, NyQuil, Flonase twice daily, use sinus rinses and humidifiers.  I have also sent over prednisone to help reduce her symptoms further.  Your COVID test should be back tomorrow.  Your flu test was negative.    ED Prescriptions     Medication Sig Dispense Auth. Provider   predniSONE (DELTASONE) 50 MG tablet Take 1 tab daily with food for 3 days 3 tablet Volney American, [place]      PDMP not reviewed this encounter.   Volney American, [place] 10/08/22 1134

## 2022-10-08 NOTE — ED Triage Notes (Signed)
Pt reports headache,fatigue, body aches, congestion,and  pressure behind her eyes x 4 days.

## 2022-10-09 LAB — SARS CORONAVIRUS 2 (TAT 6-24 HRS): SARS Coronavirus 2: NEGATIVE

## 2023-03-26 DIAGNOSIS — K219 Gastro-esophageal reflux disease without esophagitis: Secondary | ICD-10-CM | POA: Diagnosis not present

## 2023-03-26 DIAGNOSIS — Z809 Family history of malignant neoplasm, unspecified: Secondary | ICD-10-CM | POA: Diagnosis not present

## 2023-03-26 DIAGNOSIS — Z008 Encounter for other general examination: Secondary | ICD-10-CM | POA: Diagnosis not present

## 2023-03-26 DIAGNOSIS — Z8249 Family history of ischemic heart disease and other diseases of the circulatory system: Secondary | ICD-10-CM | POA: Diagnosis not present

## 2023-03-26 DIAGNOSIS — K5900 Constipation, unspecified: Secondary | ICD-10-CM | POA: Diagnosis not present

## 2023-03-26 DIAGNOSIS — M1990 Unspecified osteoarthritis, unspecified site: Secondary | ICD-10-CM | POA: Diagnosis not present

## 2023-05-13 ENCOUNTER — Ambulatory Visit
Admission: EM | Admit: 2023-05-13 | Discharge: 2023-05-13 | Disposition: A | Discharge status: Home / Self Care | Payer: Medicare HMO

## 2023-05-13 DIAGNOSIS — S60511A Abrasion of right hand, initial encounter: Secondary | ICD-10-CM | POA: Diagnosis not present

## 2023-05-13 DIAGNOSIS — S8002XA Contusion of left knee, initial encounter: Secondary | ICD-10-CM | POA: Diagnosis not present

## 2023-05-13 DIAGNOSIS — S60222A Contusion of left hand, initial encounter: Secondary | ICD-10-CM

## 2023-05-13 DIAGNOSIS — S0033XA Contusion of nose, initial encounter: Secondary | ICD-10-CM

## 2023-05-13 DIAGNOSIS — S0031XA Abrasion of nose, initial encounter: Secondary | ICD-10-CM

## 2023-05-13 NOTE — Discharge Instructions (Signed)
Continue to monitor symptoms for worsening.  As discussed, continue applying ice to the affected areas.  Apply ice for 15 to 20 minutes, remove for 1 hour, repeat is much as possible to help with bruising and swelling. For pain, you may take over-the-counter Tylenol as needed. If symptoms fail to improve, it is recommended that you follow-up with ENT or go to the emergency department for further evaluation. Follow-up as needed.

## 2023-05-13 NOTE — ED Triage Notes (Signed)
Pt states she was walking outside and she fell last night. Now having bruising to left hand, left knee and swelling with small laceration to nose.

## 2023-05-13 NOTE — ED Provider Notes (Signed)
[facility]    CSN: [id number] Arrival date & time: 05/13/23  0813      History   Chief Complaint Chief Complaint  Patient presents with   Hand Pain   Knee Pain    HPI Jasmine Kelly is a 67 y.o. female.   The history is provided by the patient.   Patient presents for complaints of pain to her nose, left hand, and left knee.  Patient states that she fell last evening on gravel.  States that she landed on her nose and face.  She denies loss of consciousness.  She does have an abrasion to the bridge of the nose, to the left hand, and to the left knee.  She also has a mild abrasion to the palm of the right hand.  Patient denies swelling, numbness, tingling, decreased range of motion, or pain.  She states that her nose "does not feel right", but denies swelling, bruising, or pain when she touches or moves her nose.  She states that she has been applying ice to the affected areas.  Past Medical History:  Diagnosis Date   Depression     Patient Active Problem List   Diagnosis Date Noted   Depression 04/04/2022    Past Surgical History:  Procedure Laterality Date   ANTERIOR AND POSTERIOR REPAIR  05/22/2011   Procedure: ANTERIOR (CYSTOCELE) AND POSTERIOR REPAIR (RECTOCELE);  Surgeon: Juluis Mire;  Location: [facility];  Service: Gynecology;  Laterality: N/A;  Sacrospinous ligament suspension   BLADDER SUSPENSION  05/22/2011   Procedure: TRANSVAGINAL TAPE (TVT) PROCEDURE;  Surgeon: Juluis Mire;  Location: [facility];  Service: Gynecology;  Laterality: N/A;  Mid urethral sling   CYSTOSCOPY  05/22/2011   Procedure: CYSTOSCOPY;  Surgeon: Raphael Gibney McComb;  Location: [facility];  Service: Gynecology;  Laterality: N/A;   DILATION AND CURETTAGE OF UTERUS     LAPAROSCOPIC ASSISTED VAGINAL HYSTERECTOMY  05/22/2011   Procedure: LAPAROSCOPIC ASSISTED VAGINAL HYSTERECTOMY;  Surgeon: Juluis Mire;  Location: [facility];  Service: Gynecology;  Laterality: Bilateral;  bilateral salpingo-oophorectomy    MASS EXCISION Left 06/26/2013   Procedure: MINOR EXCISION OF MASS left thumb foreign body removal ;  Surgeon: Tami Ribas, MD;  Location: [facility];  Service: Orthopedics;  Laterality: Left;    OB History   No obstetric history on file.      Home Medications    Prior to Admission medications   Medication Sig Start Date End Date Taking? Authorizing Provider  cetirizine (ZYRTEC) 10 MG tablet Take by mouth. 04/23/18  Yes [provider]  Cholecalciferol (VITAMIN D) 10 MCG/ML LIQD Vitamin D   Yes [provider]  ibuprofen (ADVIL,MOTRIN) 200 MG tablet Take 400 mg by mouth as needed. Prn headache   Yes [provider]  Multiple Vitamin (MULTIVITAMIN) capsule Take 1 capsule by mouth daily.   Yes [provider]  Omega-3 Fatty Acids (OMEGA-3 PO) Take 2 capsules by mouth daily. Pt takes OTC product called Omega XL   Yes [provider]  albuterol (VENTOLIN HFA) 108 (90 Base) MCG/ACT inhaler Inhale 1-2 puffs into the lungs every 6 (six) hours as needed for wheezing or shortness of breath. 11/24/21   Particia Nearing, PA-C  fluticasone [facility]) 50 MCG/ACT nasal spray Place 2 sprays into both nostrils daily. 08/17/21   Domenick Gong, MD  ipratropium (ATROVENT) 0.03 % nasal spray Place 2 sprays into both nostrils 2 (two) times daily. 07/29/20   Joaquin Courts  S, NP  predniSONE (DELTASONE) 50 MG tablet Take 1 tab daily with food for 3 days 10/08/22   Particia Nearing, PA-C  venlafaxine XR (EFFEXOR-XR) 37.5 MG 24 hr capsule Take 37.5 mg by mouth daily. 03/13/22   [provider]    Family History Family History  Problem Relation Age of Onset   Cancer - Lung Mother    Lung cancer Father    Colon cancer Brother     Social History Social History   Tobacco Use   Smoking status: Never    Passive exposure: Never   Smokeless tobacco: Never  Vaping Use   Vaping status: Never Used  Substance Use Topics    Alcohol use: Yes    Comment: occasionally   Drug use: No     Allergies   Patient has no known allergies.   Review of Systems Review of Systems Per HPI  Physical Exam Triage Vital Signs ED Triage Vitals  Encounter Vitals Group     BP 05/13/23 0907 115/80     Systolic BP Percentile --      Diastolic BP Percentile --      Pulse Rate 05/13/23 0907 79     Resp 05/13/23 0907 16     Temp 05/13/23 0907 98 F (36.7 C)     Temp Source 05/13/23 0907 Oral     SpO2 05/13/23 0907 97 %     Weight --      Height --      Head Circumference --      Peak Flow --      Pain Score 05/13/23 0908 0     Pain Loc --      Pain Education --      Exclude from Growth Chart --    No data found.  Updated Vital Signs BP 115/80 (BP Location: Right Arm)   Pulse 79   Temp 98 F (36.7 C) (Oral)   Resp 16   SpO2 97%   Visual Acuity Right Eye Distance:   Left Eye Distance:   Bilateral Distance:    Right Eye Near:   Left Eye Near:    Bilateral Near:     Physical Exam Vitals and nursing note reviewed.  Constitutional:      General: She is not in acute distress.    Appearance: Normal appearance.  HENT:     Head: Normocephalic.     Nose: Signs of injury and laceration present. No nasal deformity, septal deviation or nasal tenderness.     Right Turbinates: Enlarged and swollen.     Left Turbinates: Enlarged and swollen.     Right Sinus: No maxillary sinus tenderness or frontal sinus tenderness.     Left Sinus: No maxillary sinus tenderness or frontal sinus tenderness.      Mouth/Throat:     Lips: Pink.  Eyes:     Extraocular Movements: Extraocular movements intact.     Pupils: Pupils are equal, round, and reactive to light.  Musculoskeletal:     Cervical back: Normal range of motion.  Skin:    General: Skin is warm and dry.     Comments: Abrasion noted to the bridge of the nose, to the dorsal aspect of the left hand, to the left knee, and to the palm of the right hand.  Erythema  noted to the nose and to the left knee.  She also has bruising noted to the dorsal aspect of the left hand.+FROM to the left hand, left knee  and right hand.  Areas are nontender to palpation.  Neurological:     General: No focal deficit present.     Mental Status: She is alert and oriented to person, place, and time.  Psychiatric:        Mood and Affect: Mood normal.        Behavior: Behavior normal.      UC Treatments / Results  Labs (all labs ordered are listed, but only abnormal results are displayed) Labs Reviewed - No data to display  EKG   Radiology No results found.  Procedures Procedures (including critical care time)  Medications Ordered in UC Medications - No data to display  Initial Impression / Assessment and Plan / UC Course  I have reviewed the triage vital signs and the nursing notes.  Pertinent labs & imaging results that were available during my care of the patient were reviewed by me and considered in my medical decision making (see chart for details).  The patient is well-appearing, she is in no acute distress, vital signs are stable.  The patient presents status post fall 1 day ago.  On exam, patient does not have any noted areas of bruising or tenderness.  She has full range of motion of the left hand and left knee.  Do not suspect a fracture or dislocation given the full range of motion that is present and no obvious deformity.  Will defer imaging at this time.  Suspect contusions of the nose, left knee, and left hand.  With regard to her nose, she does not have any tenderness or swelling presen.  Patient advised facial imaging is not available in this clinic.  Advised patient to follow-up with ENT or in the emergency department if symptoms fail to improve.  Patient was given supportive care recommendations to include over-the-counter analgesics and ice to the affected areas.  Patient advised to follow-up with ENT or in the emergency department if symptoms  fail to improve.  Patient is in agreement with this plan of care and verbalizes understanding.  All questions were answered.  Patient stable for discharge. Final Clinical Impressions(s) / UC Diagnoses   Final diagnoses:  Contusion of nose, initial encounter  Contusion of dorsum of left hand  Contusion of left knee, initial encounter  Nasal abrasion, initial encounter  Hand abrasion, right, initial encounter     Discharge Instructions      Continue to monitor symptoms for worsening.  As discussed, continue applying ice to the affected areas.  Apply ice for 15 to 20 minutes, remove for 1 hour, repeat is much as possible to help with bruising and swelling. For pain, you may take over-the-counter Tylenol as needed. If symptoms fail to improve, it is recommended that you follow-up with ENT or go to the emergency department for further evaluation. Follow-up as needed.     ED Prescriptions   None    PDMP not reviewed this encounter.   Abran Cantor, NP 05/13/23 [phone]

## 2023-06-04 DIAGNOSIS — H5213 Myopia, bilateral: Secondary | ICD-10-CM | POA: Diagnosis not present

## 2023-06-27 DIAGNOSIS — R69 Illness, unspecified: Secondary | ICD-10-CM | POA: Diagnosis not present

## 2023-07-02 DIAGNOSIS — M25562 Pain in left knee: Secondary | ICD-10-CM | POA: Diagnosis not present

## 2023-07-23 DIAGNOSIS — Z01419 Encounter for gynecological examination (general) (routine) without abnormal findings: Secondary | ICD-10-CM | POA: Diagnosis not present

## 2023-07-23 DIAGNOSIS — Z6826 Body mass index (BMI) 26.0-26.9, adult: Secondary | ICD-10-CM | POA: Diagnosis not present

## 2023-07-23 DIAGNOSIS — Z1272 Encounter for screening for malignant neoplasm of vagina: Secondary | ICD-10-CM | POA: Diagnosis not present

## 2023-07-23 DIAGNOSIS — Z1231 Encounter for screening mammogram for malignant neoplasm of breast: Secondary | ICD-10-CM | POA: Diagnosis not present

## 2023-07-27 ENCOUNTER — Inpatient Hospital Stay
Admission: RE | Admit: 2023-07-27 | Discharge: 2023-07-27 | Discharge status: Home / Self Care | Payer: Medicare HMO | Source: Ambulatory Visit | Attending: Nurse Practitioner

## 2023-07-27 VITALS — BP 107/60 | HR 74 | Temp 97.9°F | Resp 18

## 2023-07-27 DIAGNOSIS — J0140 Acute pansinusitis, unspecified: Secondary | ICD-10-CM

## 2023-07-27 MED ORDER — AMOXICILLIN-POT CLAVULANATE 875-125 MG PO TABS: 1.0000 | ORAL_TABLET | Freq: Two times a day (BID) | ORAL | 0 refills | Status: AC

## 2023-07-27 NOTE — Discharge Instructions (Signed)
Take medication as prescribed. Begin use of the Flonase that you have at home and continue Zyrtec. Increase fluids and allow for plenty of rest. May take over-the-counter Tylenol as needed for pain, fever, or general discomfort. Recommend normal saline nasal spray throughout the day for nasal congestion and runny nose. If your cough worsens, recommend use of a humidifier in your bedroom at nighttime during sleep and sleeping elevated on pillows while symptoms persist. If symptoms do not improve with this treatment, you may follow-up in this clinic or with your primary care physician for further evaluation. Follow-up as needed.

## 2023-07-27 NOTE — ED Provider Notes (Signed)
[facility]    CSN: [id number] Arrival date & time: 07/27/23  1001      History   Chief Complaint Chief Complaint  Patient presents with   Headache    Sinus headache for the last week - Entered by patient    HPI Jasmine Kelly is a 67 y.o. female.   The history is provided by the patient.   Patient presents for complaints of headache, nasal congestion, sinus pressure, postnasal drainage, and a mild cough that is been present for the past week.  Patient denies fever, chills, ear pain, ear drainage, wheezing, difficulty breathing, chest pain, abdominal pain, nausea, vomiting, diarrhea, or rash.  Patient states that she has used several over-the-counter medications with minimal relief of her symptoms.  Patient also reports that she has been taking Zyrtec daily for seasonal allergies.  Reports that she took 2 home COVID test, both were negative.  Past Medical History:  Diagnosis Date   Depression     Patient Active Problem List   Diagnosis Date Noted   Depression 04/04/2022    Past Surgical History:  Procedure Laterality Date   ANTERIOR AND POSTERIOR REPAIR  05/22/2011   Procedure: ANTERIOR (CYSTOCELE) AND POSTERIOR REPAIR (RECTOCELE);  Surgeon: Juluis Mire;  Location: [facility];  Service: Gynecology;  Laterality: N/A;  Sacrospinous ligament suspension   BLADDER SUSPENSION  05/22/2011   Procedure: TRANSVAGINAL TAPE (TVT) PROCEDURE;  Surgeon: Juluis Mire;  Location: [facility];  Service: Gynecology;  Laterality: N/A;  Mid urethral sling   CYSTOSCOPY  05/22/2011   Procedure: CYSTOSCOPY;  Surgeon: Raphael Gibney McComb;  Location: [facility];  Service: Gynecology;  Laterality: N/A;   DILATION AND CURETTAGE OF UTERUS     LAPAROSCOPIC ASSISTED VAGINAL HYSTERECTOMY  05/22/2011   Procedure: LAPAROSCOPIC ASSISTED VAGINAL HYSTERECTOMY;  Surgeon: Juluis Mire;  Location: [facility];  Service: Gynecology;  Laterality: Bilateral;  bilateral salpingo-oophorectomy   MASS EXCISION Left 06/26/2013    Procedure: MINOR EXCISION OF MASS left thumb foreign body removal ;  Surgeon: Tami Ribas, MD;  Location: [facility];  Service: Orthopedics;  Laterality: Left;    OB History   No obstetric history on file.      Home Medications    Prior to Admission medications   Medication Sig Start Date End Date Taking? Authorizing Provider  amoxicillin-clavulanate (AUGMENTIN) 875-125 MG tablet Take 1 tablet by mouth every 12 (twelve) hours. 07/27/23  Yes Leath-Warren, Sadie Haber, NP  albuterol (VENTOLIN HFA) 108 (90 Base) MCG/ACT inhaler Inhale 1-2 puffs into the lungs every 6 (six) hours as needed for wheezing or shortness of breath. 11/24/21   Particia Nearing, PA-C  cetirizine (ZYRTEC) 10 MG tablet Take by mouth. 04/23/18   [provider]  Cholecalciferol (VITAMIN D) 10 MCG/ML LIQD Vitamin D    [provider]  fluticasone (FLONASE) 50 MCG/ACT nasal spray Place 2 sprays into both nostrils daily. 08/17/21   Domenick Gong, MD  ibuprofen (ADVIL,MOTRIN) 200 MG tablet Take 400 mg by mouth as needed. Prn headache    [provider]  ipratropium (ATROVENT) 0.03 % nasal spray Place 2 sprays into both nostrils 2 (two) times daily. 07/29/20   Bing Neighbors, NP  Multiple Vitamin (MULTIVITAMIN) capsule Take 1 capsule by mouth daily.    [provider]  Omega-3 Fatty Acids (OMEGA-3 PO) Take 2 capsules by mouth daily. Pt takes OTC product called Omega XL    [provider]  predniSONE (DELTASONE) 50  MG tablet Take 1 tab daily with food for 3 days 10/08/22   Particia Nearing, PA-C  venlafaxine XR (EFFEXOR-XR) 37.5 MG 24 hr capsule Take 37.5 mg by mouth daily. 03/13/22   [provider]    Family History Family History  Problem Relation Age of Onset   Cancer - Lung Mother    Lung cancer Father    Colon cancer Brother     Social History Social History   Tobacco Use   Smoking status: Never    Passive exposure: Never    Smokeless tobacco: Never  Vaping Use   Vaping status: Never Used  Substance Use Topics   Alcohol use: Yes    Comment: occasionally   Drug use: No     Allergies   Patient has no known allergies.   Review of Systems Review of Systems Per HPI  Physical Exam Triage Vital Signs ED Triage Vitals [07/27/23 1038]  Encounter Vitals Group     BP 107/60     Systolic BP Percentile      Diastolic BP Percentile      Pulse Rate 74     Resp 18     Temp 97.9 F (36.6 C)     Temp Source Oral     SpO2 95 %     Weight      Height      Head Circumference      Peak Flow      Pain Score 4     Pain Loc      Pain Education      Exclude from Growth Chart    No data found.  Updated Vital Signs BP 107/60 (BP Location: Right Arm)   Pulse 74   Temp 97.9 F (36.6 C) (Oral)   Resp 18   SpO2 95%   Visual Acuity Right Eye Distance:   Left Eye Distance:   Bilateral Distance:    Right Eye Near:   Left Eye Near:    Bilateral Near:     Physical Exam Vitals and nursing note reviewed.  Constitutional:      General: She is not in acute distress.    Appearance: Normal appearance.  HENT:     Head: Normocephalic.     Right Ear: Tympanic membrane, ear canal and external ear normal.     Left Ear: Tympanic membrane, ear canal and external ear normal.     Nose: Congestion present.     Right Turbinates: Enlarged and swollen.     Left Turbinates: Enlarged and swollen.     Right Sinus: No maxillary sinus tenderness or frontal sinus tenderness.     Left Sinus: Maxillary sinus tenderness and frontal sinus tenderness present.     Mouth/Throat:     Lips: Pink.     Mouth: Mucous membranes are moist.     Pharynx: Uvula midline. Postnasal drip present. No pharyngeal swelling or posterior oropharyngeal erythema.     Comments: Cobblestoning present to posterior oropharynx  Eyes:     Extraocular Movements: Extraocular movements intact.     Conjunctiva/sclera: Conjunctivae normal.     Pupils:  Pupils are equal, round, and reactive to light.  Cardiovascular:     Rate and Rhythm: Normal rate and regular rhythm.     Pulses: Normal pulses.     Heart sounds: Normal heart sounds.  Pulmonary:     Effort: Pulmonary effort is normal. No respiratory distress.     Breath sounds: Normal breath sounds. No stridor. No  wheezing, rhonchi or rales.  Abdominal:     General: Bowel sounds are normal.     Palpations: Abdomen is soft.     Tenderness: There is no abdominal tenderness.  Musculoskeletal:     Cervical back: Normal range of motion.  Lymphadenopathy:     Cervical: No cervical adenopathy.  Skin:    General: Skin is warm and dry.  Neurological:     General: No focal deficit present.     Mental Status: She is alert and oriented to person, place, and time.  Psychiatric:        Mood and Affect: Mood normal.      UC Treatments / Results  Labs (all labs ordered are listed, but only abnormal results are displayed) Labs Reviewed - No data to display  EKG   Radiology No results found.  Procedures Procedures (including critical care time)  Medications Ordered in UC Medications - No data to display  Initial Impression / Assessment and Plan / UC Course  I have reviewed the triage vital signs and the nursing notes.  Pertinent labs & imaging results that were available during my care of the patient were reviewed by me and considered in my medical decision making (see chart for details).  On exam, patient with moderate right frontal and maxillary sinus tenderness.  Symptoms have also been present for the past week, with minimal relief of symptoms with use of over-the-counter medications or other antihistamines.  Patient with 2 negative home COVID test.  Will treat patient for bacterial sinusitis with Augmentin 875/125 mg tablets.  Patient advised to begin use of Flonase that she currently has at home.  Supportive care recommendations were provided and discussed with the patient to  include fluids, rest, over-the-counter analgesics, normal saline nasal spray.  Discussed indications with the patient of when follow-up be necessary.  Patient was in agreement with this plan of care and verbalized understanding.  All questions were answered.  Patient stable for discharge.  Final Clinical Impressions(s) / UC Diagnoses   Final diagnoses:  Acute pansinusitis, recurrence not specified     Discharge Instructions      Take medication as prescribed. Begin use of the Flonase that you have at home and continue Zyrtec. Increase fluids and allow for plenty of rest. May take over-the-counter Tylenol as needed for pain, fever, or general discomfort. Recommend normal saline nasal spray throughout the day for nasal congestion and runny nose. If your cough worsens, recommend use of a humidifier in your bedroom at nighttime during sleep and sleeping elevated on pillows while symptoms persist. If symptoms do not improve with this treatment, you may follow-up in this clinic or with your primary care physician for further evaluation. Follow-up as needed.     ED Prescriptions     Medication Sig Dispense Auth. Provider   amoxicillin-clavulanate (AUGMENTIN) 875-125 MG tablet Take 1 tablet by mouth every 12 (twelve) hours. 14 tablet Leath-Warren, Sadie Haber, NP      PDMP not reviewed this encounter.   Abran Cantor, NP 07/27/23 1059

## 2023-07-27 NOTE — ED Triage Notes (Signed)
Pt reports having sinus headaches that began about a week ago.  Home interventions: nyquil, tylenol sinus

## 2023-08-09 ENCOUNTER — Ambulatory Visit
Admission: RE | Admit: 2023-08-09 | Discharge: 2023-08-09 | Disposition: A | Discharge status: Home / Self Care | Payer: Medicare HMO | Source: Ambulatory Visit | Attending: Nurse Practitioner | Admitting: Nurse Practitioner

## 2023-08-09 VITALS — BP 110/69 | HR 76 | Temp 98.1°F | Resp 16

## 2023-08-09 DIAGNOSIS — J019 Acute sinusitis, unspecified: Secondary | ICD-10-CM | POA: Diagnosis not present

## 2023-08-09 DIAGNOSIS — Z1152 Encounter for screening for COVID-19: Secondary | ICD-10-CM | POA: Diagnosis not present

## 2023-08-09 DIAGNOSIS — B9789 Other viral agents as the cause of diseases classified elsewhere: Secondary | ICD-10-CM

## 2023-08-09 MED ORDER — PSEUDOEPH-BROMPHEN-DM 30-2-10 MG/5ML PO SYRP: 5.0000 mL | ORAL_SOLUTION | Freq: Four times a day (QID) | ORAL | 0 refills | Status: AC | PRN

## 2023-08-09 NOTE — ED Provider Notes (Signed)
[facility]    CSN: [id number] Arrival date & time: 08/09/23  9629      History   Chief Complaint Chief Complaint  Patient presents with   Headache    Sinus pressure and body aches - Entered by patient    HPI Jasmine Kelly is a 67 y.o. female.   The history is provided by the patient.   Patient presents with a 1 day history of sinus pressure, generalized bodyaches, headache, and mild cough and postnasal drainage.  Patient denies fever, chills, ear pain, ear drainage, runny nose, wheezing, difficulty breathing, chest pain, abdominal pain, nausea, vomiting, diarrhea, or rash.  Patient reports she was treated for an acute sinusitis earlier this month.  States that she took the entire course of the medication and did feel better.  Patient states that her family has also been recently diagnosed with COVID.  Reports that she took over-the-counter sinus medication for her symptoms.  States that she does use Flonase and allergy medication daily.  Past Medical History:  Diagnosis Date   Depression     Patient Active Problem List   Diagnosis Date Noted   Depression 04/04/2022    Past Surgical History:  Procedure Laterality Date   ANTERIOR AND POSTERIOR REPAIR  05/22/2011   Procedure: ANTERIOR (CYSTOCELE) AND POSTERIOR REPAIR (RECTOCELE);  Surgeon: Juluis Mire;  Location: [facility];  Service: Gynecology;  Laterality: N/A;  Sacrospinous ligament suspension   BLADDER SUSPENSION  05/22/2011   Procedure: TRANSVAGINAL TAPE (TVT) PROCEDURE;  Surgeon: Juluis Mire;  Location: [facility];  Service: Gynecology;  Laterality: N/A;  Mid urethral sling   CYSTOSCOPY  05/22/2011   Procedure: CYSTOSCOPY;  Surgeon: Raphael Gibney McComb;  Location: [facility];  Service: Gynecology;  Laterality: N/A;   DILATION AND CURETTAGE OF UTERUS     LAPAROSCOPIC ASSISTED VAGINAL HYSTERECTOMY  05/22/2011   Procedure: LAPAROSCOPIC ASSISTED VAGINAL HYSTERECTOMY;  Surgeon: Juluis Mire;  Location: [facility];  Service:  Gynecology;  Laterality: Bilateral;  bilateral salpingo-oophorectomy   MASS EXCISION Left 06/26/2013   Procedure: MINOR EXCISION OF MASS left thumb foreign body removal ;  Surgeon: Tami Ribas, MD;  Location: [facility];  Service: Orthopedics;  Laterality: Left;    OB History   No obstetric history on file.      Home Medications    Prior to Admission medications   Medication Sig Start Date End Date Taking? Authorizing Provider  brompheniramine-pseudoephedrine-DM 30-2-10 MG/5ML syrup Take 5 mLs by mouth 4 (four) times daily as needed. 08/09/23  Yes Leath-Warren, Sadie Haber, NP  albuterol (VENTOLIN HFA) 108 (90 Base) MCG/ACT inhaler Inhale 1-2 puffs into the lungs every 6 (six) hours as needed for wheezing or shortness of breath. 11/24/21   Particia Nearing, PA-C  amoxicillin-clavulanate (AUGMENTIN) 875-125 MG tablet Take 1 tablet by mouth every 12 (twelve) hours. 07/27/23   Leath-Warren, Sadie Haber, NP  cetirizine (ZYRTEC) 10 MG tablet Take by mouth. 04/23/18   [provider]  Cholecalciferol (VITAMIN D) 10 MCG/ML LIQD Vitamin D    [provider]  fluticasone (FLONASE) 50 MCG/ACT nasal spray Place 2 sprays into both nostrils daily. 08/17/21   Domenick Gong, MD  ibuprofen (ADVIL,MOTRIN) 200 MG tablet Take 400 mg by mouth as needed. Prn headache    [provider]  ipratropium (ATROVENT) 0.03 % nasal spray Place 2 sprays into both nostrils 2 (two) times daily. 07/29/20   Bing Neighbors, NP  Multiple Vitamin (MULTIVITAMIN) capsule Take  1 capsule by mouth daily.    [provider]  Omega-3 Fatty Acids (OMEGA-3 PO) Take 2 capsules by mouth daily. Pt takes OTC product called Omega XL    [provider]  predniSONE (DELTASONE) 50 MG tablet Take 1 tab daily with food for 3 days 10/08/22   Particia Nearing, PA-C  venlafaxine XR (EFFEXOR-XR) 37.5 MG 24 hr capsule Take 37.5 mg by mouth daily. 03/13/22   [provider]     Family History Family History  Problem Relation Age of Onset   Cancer - Lung Mother    Lung cancer Father    Colon cancer Brother     Social History Social History   Tobacco Use   Smoking status: Never    Passive exposure: Never   Smokeless tobacco: Never  Vaping Use   Vaping status: Never Used  Substance Use Topics   Alcohol use: Yes    Comment: occasionally   Drug use: No     Allergies   Patient has no known allergies.   Review of Systems Review of Systems Per HPI  Physical Exam Triage Vital Signs ED Triage Vitals  Encounter Vitals Group     BP 08/09/23 0940 110/69     Systolic BP Percentile --      Diastolic BP Percentile --      Pulse Rate 08/09/23 0940 76     Resp 08/09/23 0940 16     Temp 08/09/23 0940 98.1 F (36.7 C)     Temp Source 08/09/23 0940 Oral     SpO2 08/09/23 0940 95 %     Weight --      Height --      Head Circumference --      Peak Flow --      Pain Score 08/09/23 0941 2     Pain Loc --      Pain Education --      Exclude from Growth Chart --    No data found.  Updated Vital Signs BP 110/69 (BP Location: Right Arm)   Pulse 76   Temp 98.1 F (36.7 C) (Oral)   Resp 16   SpO2 95%   Visual Acuity Right Eye Distance:   Left Eye Distance:   Bilateral Distance:    Right Eye Near:   Left Eye Near:    Bilateral Near:     Physical Exam Vitals and nursing note reviewed.  Constitutional:      Appearance: She is well-developed.  HENT:     Head: Normocephalic.     Right Ear: Tympanic membrane, ear canal and external ear normal.     Left Ear: Tympanic membrane, ear canal and external ear normal.     Nose: Congestion present.     Right Turbinates: Enlarged and swollen.     Left Turbinates: Enlarged and swollen.     Right Sinus: No maxillary sinus tenderness.     Left Sinus: Maxillary sinus tenderness and frontal sinus tenderness present.     Mouth/Throat:     Lips: Pink.     Mouth: Mucous membranes are moist.      Pharynx: Oropharynx is clear. Uvula midline. Postnasal drip present. No pharyngeal swelling, oropharyngeal exudate, posterior oropharyngeal erythema or uvula swelling.     Comments: Cobblestoning present to posterior oropharynx  Eyes:     Extraocular Movements: Extraocular movements intact.     Conjunctiva/sclera: Conjunctivae normal.     Pupils: Pupils are equal, round, and reactive to light.  Cardiovascular:     Rate and Rhythm: Normal rate and regular rhythm.     Pulses: Normal pulses.     Heart sounds: Normal heart sounds.  Pulmonary:     Effort: Pulmonary effort is normal. No respiratory distress.     Breath sounds: Normal breath sounds. No stridor. No wheezing, rhonchi or rales.  Abdominal:     General: Bowel sounds are normal.     Palpations: Abdomen is soft.     Tenderness: There is no abdominal tenderness.  Musculoskeletal:     Cervical back: Normal range of motion.  Skin:    General: Skin is warm and dry.  Neurological:     General: No focal deficit present.     Mental Status: She is alert and oriented to person, place, and time.  Psychiatric:        Mood and Affect: Mood normal.        Behavior: Behavior normal.      UC Treatments / Results  Labs (all labs ordered are listed, but only abnormal results are displayed) Labs Reviewed  SARS CORONAVIRUS 2 (TAT 6-24 HRS)    EKG   Radiology No results found.  Procedures Procedures (including critical care time)  Medications Ordered in UC Medications - No data to display  Initial Impression / Assessment and Plan / UC Course  I have reviewed the triage vital signs and the nursing notes.  Pertinent labs & imaging results that were available during my care of the patient were reviewed by me and considered in my medical decision making (see chart for details).  COVID test is pending.  Differential diagnoses include viral sinusitis.  Will provide symptomatic treatment for the patient with Bromfed-DM as an  antihistamine.  Supportive care recommendations were provided and discussed with the patient to include continuing current allergy regimen, over-the-counter analgesics, normal saline nasal spray, fluids and rest.  Patient is able to receive Paxlovid if her COVID test is positive.  Discussed current isolation guidelines along with ER follow-up precautions.  Patient was in agreement with this plan of care and verbalizes understanding.  All questions were answered.  Patient stable for discharge.  Final Clinical Impressions(s) / UC Diagnoses   Final diagnoses:  Acute viral sinusitis  Encounter for screening for COVID-19     Discharge Instructions      COVID test is pending.  You will be contacted if the pending test result is positive.  You also have access to the results via MyChart. Take medication as prescribed.  Continue using Zyrtec and Flonase daily. Normal saline nasal spray throughout the day for nasal congestion and runny nose. May take over-the-counter Tylenol as needed for pain, fever, or general discomfort. If you develop new or worsening symptoms, you may follow-up in this clinic or with your primary care physician for further evaluation. Follow-up as needed.     ED Prescriptions     Medication Sig Dispense Auth. Provider   brompheniramine-pseudoephedrine-DM 30-2-10 MG/5ML syrup Take 5 mLs by mouth 4 (four) times daily as needed. 140 mL Leath-Warren, Sadie Haber, NP      PDMP not reviewed this encounter.   Abran Cantor, NP 08/09/23 1040

## 2023-08-09 NOTE — ED Triage Notes (Signed)
Pt states sinus pressure/headache and body aches since yesterday.  State she has been taking OTC sinus medicine.

## 2023-08-09 NOTE — Discharge Instructions (Signed)
COVID test is pending.  You will be contacted if the pending test result is positive.  You also have access to the results via MyChart. Take medication as prescribed.  Continue using Zyrtec and Flonase daily. Normal saline nasal spray throughout the day for nasal congestion and runny nose. May take over-the-counter Tylenol as needed for pain, fever, or general discomfort. If you develop new or worsening symptoms, you may follow-up in this clinic or with your primary care physician for further evaluation. Follow-up as needed.

## 2023-08-10 ENCOUNTER — Telehealth: Payer: Self-pay

## 2023-08-10 LAB — SARS CORONAVIRUS 2 (TAT 6-24 HRS): SARS Coronavirus 2: POSITIVE — AB

## 2023-08-10 MED ORDER — PAXLOVID (300/100) 20 X 150 MG & 10 X 100MG PO TBPK: 3.0000 | ORAL_TABLET | Freq: Two times a day (BID) | ORAL | 0 refills | Status: AC

## 2023-08-10 MED ORDER — PAXLOVID (300/100) 20 X 150 MG & 10 X 100MG PO TBPK: 3.0000 | ORAL_TABLET | Freq: Two times a day (BID) | ORAL | 0 refills | Status: DC

## 2023-11-26 DIAGNOSIS — Z79899 Other long term (current) drug therapy: Secondary | ICD-10-CM | POA: Diagnosis not present

## 2023-11-26 DIAGNOSIS — Z0000 Encounter for general adult medical examination without abnormal findings: Secondary | ICD-10-CM | POA: Diagnosis not present

## 2023-11-26 DIAGNOSIS — E559 Vitamin D deficiency, unspecified: Secondary | ICD-10-CM | POA: Diagnosis not present

## 2023-11-26 DIAGNOSIS — L309 Dermatitis, unspecified: Secondary | ICD-10-CM | POA: Diagnosis not present

## 2023-11-26 DIAGNOSIS — E785 Hyperlipidemia, unspecified: Secondary | ICD-10-CM | POA: Diagnosis not present

## 2023-11-28 DIAGNOSIS — E559 Vitamin D deficiency, unspecified: Secondary | ICD-10-CM | POA: Diagnosis not present

## 2023-11-28 DIAGNOSIS — E785 Hyperlipidemia, unspecified: Secondary | ICD-10-CM | POA: Diagnosis not present

## 2023-11-28 DIAGNOSIS — Z79899 Other long term (current) drug therapy: Secondary | ICD-10-CM | POA: Diagnosis not present

## 2023-12-18 DIAGNOSIS — J342 Deviated nasal septum: Secondary | ICD-10-CM | POA: Diagnosis not present

## 2023-12-18 DIAGNOSIS — J34829 Nasal valve collapse, unspecified: Secondary | ICD-10-CM | POA: Diagnosis not present

## 2023-12-18 DIAGNOSIS — S0992XS Unspecified injury of nose, sequela: Secondary | ICD-10-CM | POA: Diagnosis not present

## 2024-02-27 DIAGNOSIS — L309 Dermatitis, unspecified: Secondary | ICD-10-CM | POA: Diagnosis not present

## 2024-03-06 ENCOUNTER — Encounter: Payer: Self-pay | Admitting: Advanced Practice Midwife

## 2024-05-09 IMAGING — CT CT CHEST W/O CM
1 series · 15 of 34 positions shown, 19 images · non-contrast
Comparison: Chest x-ray 01/18/2022

CLINICAL DATA: Nodular opacity inferior RLL No complaints No sx ca
Non smoker 01/18/22 CXR



[Series 2: chest w/o 2mm st · axial · non-contrast · 0.78mm/px · z∈[-261,-27]mm · 15 of 139 slices shown, 19 images]
[im 11/139  mediastinal]
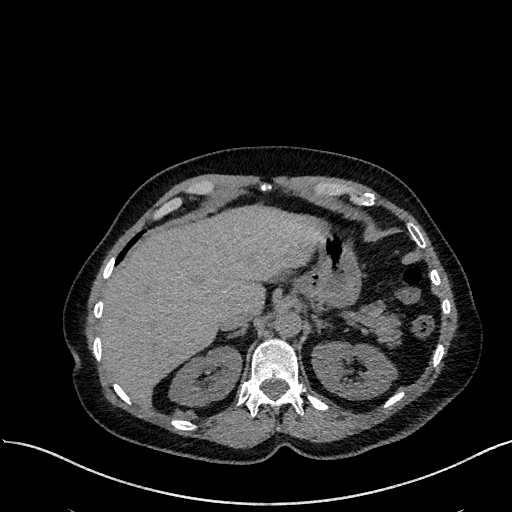
[im 11/139  lung]
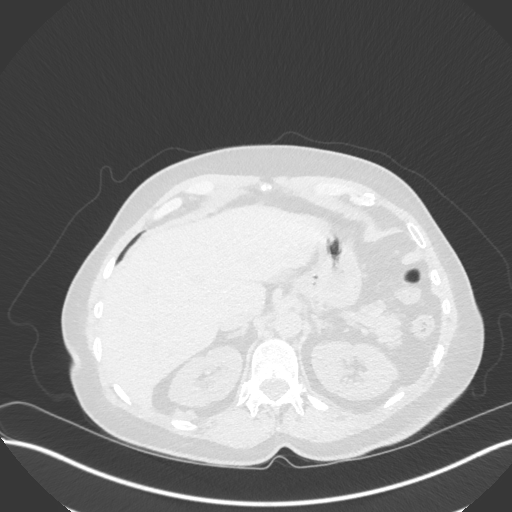
[im 21/139  lung]
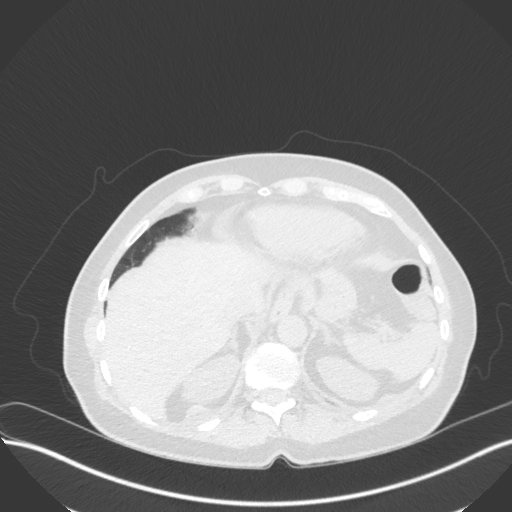
[im 28/139  lung]
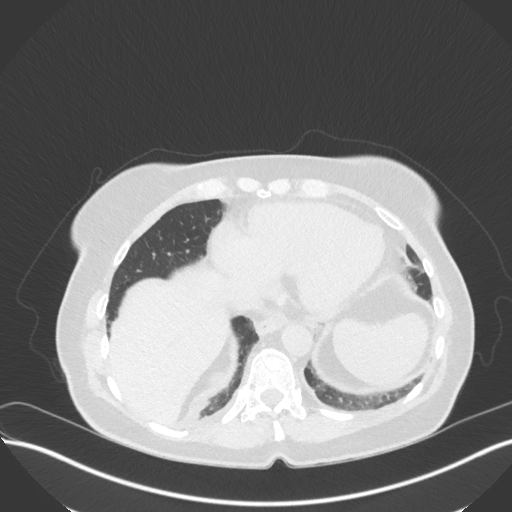
[im 36/139  lung]
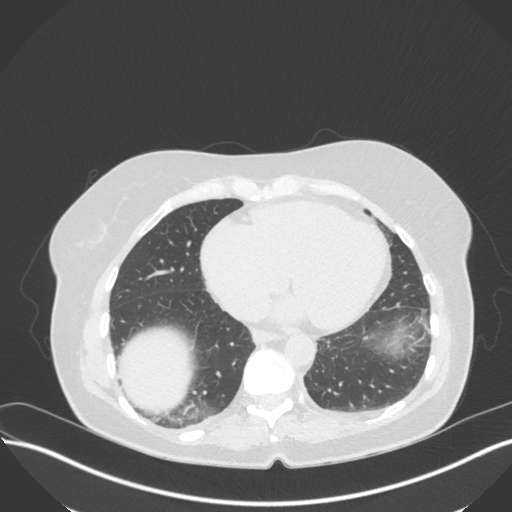
[im 47/139  mediastinal]
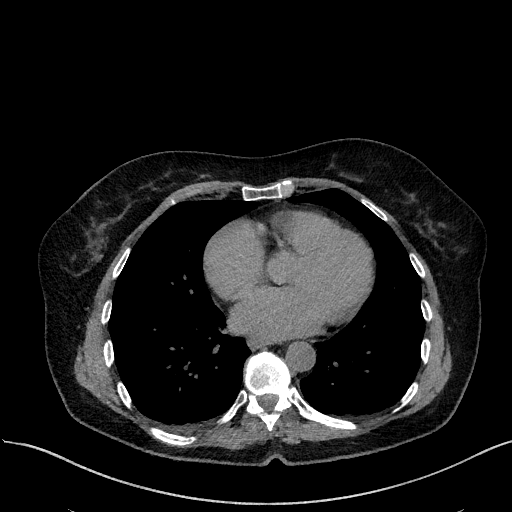
[im 47/139  lung]
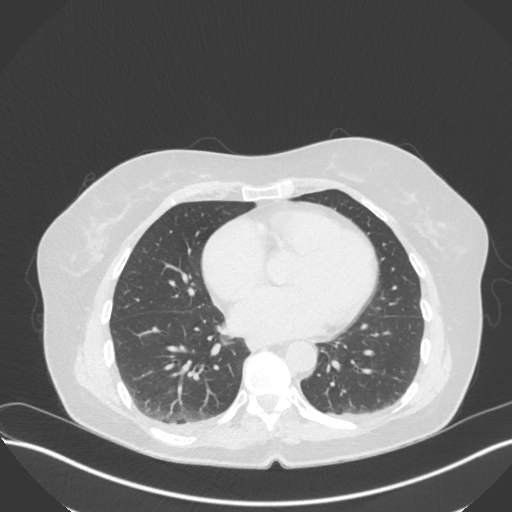
[im 56/139  lung]
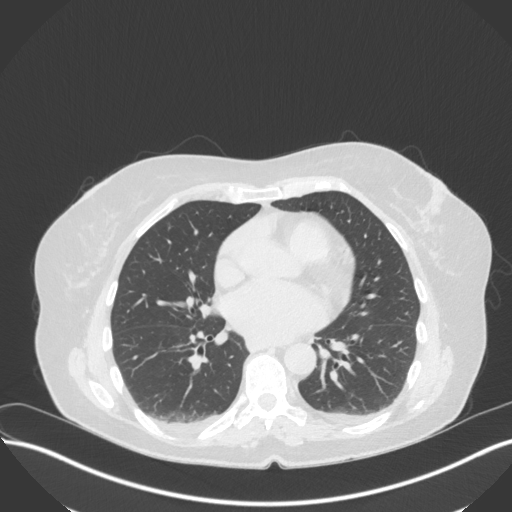
[im 62/139  lung]
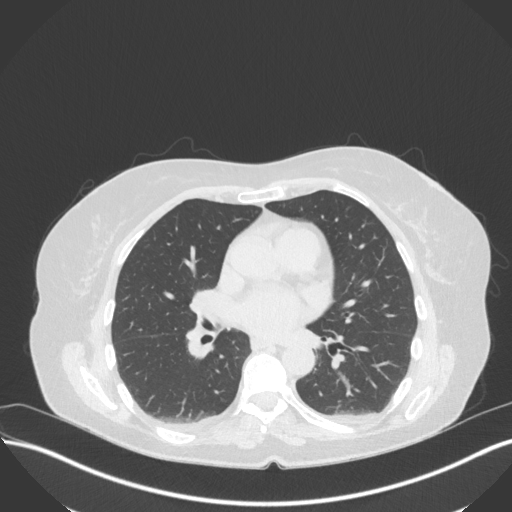
[im 72/139  lung]
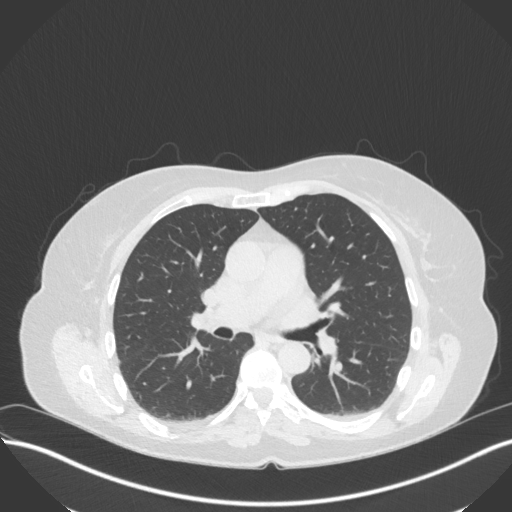
[im 77/139  mediastinal]
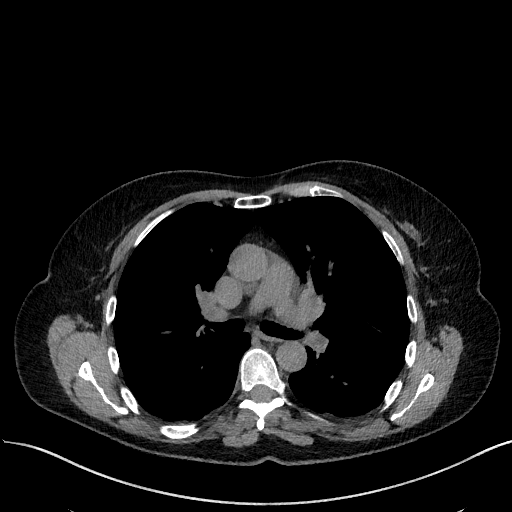
[im 77/139  lung]
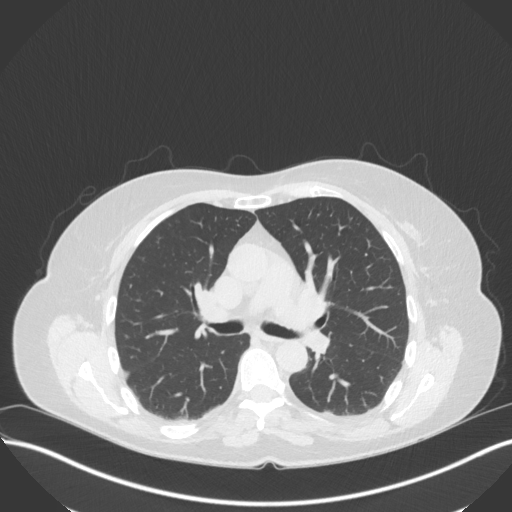
[im 83/139  lung]
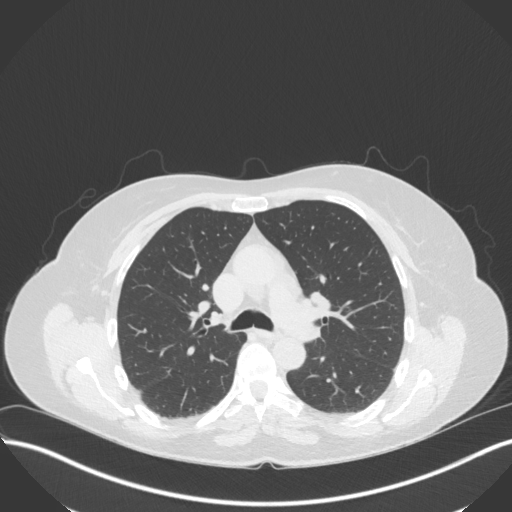
[im 93/139  lung]
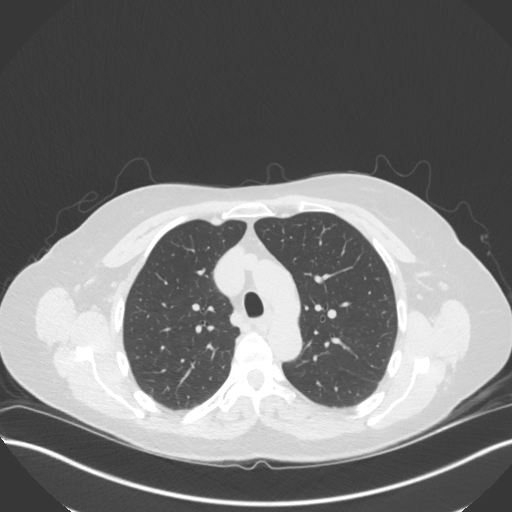
[im 103/139  lung]
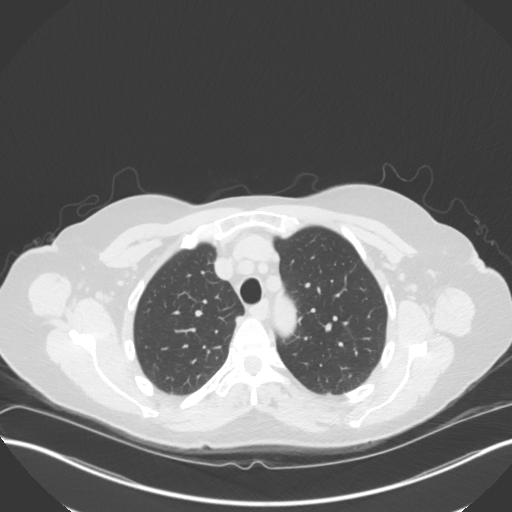
[im 111/139  mediastinal]
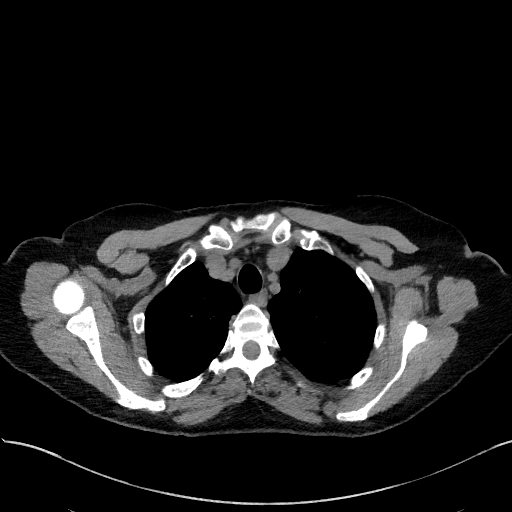
[im 111/139  lung]
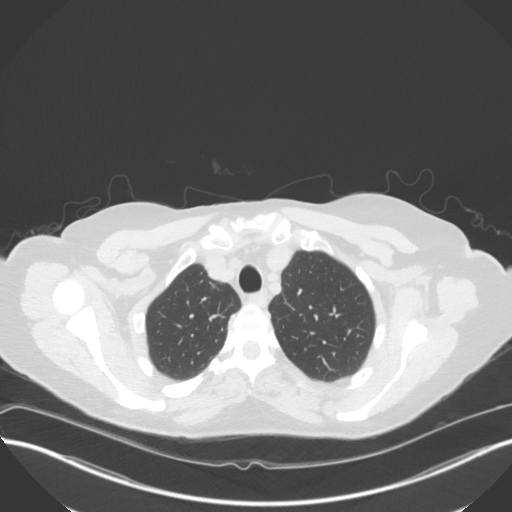
[im 118/139  lung]
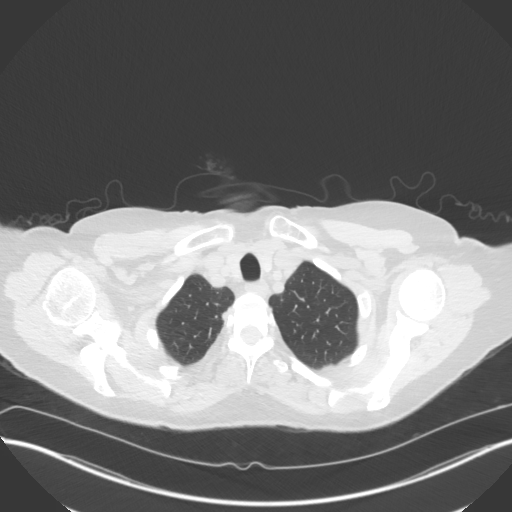
[im 128/139  lung]
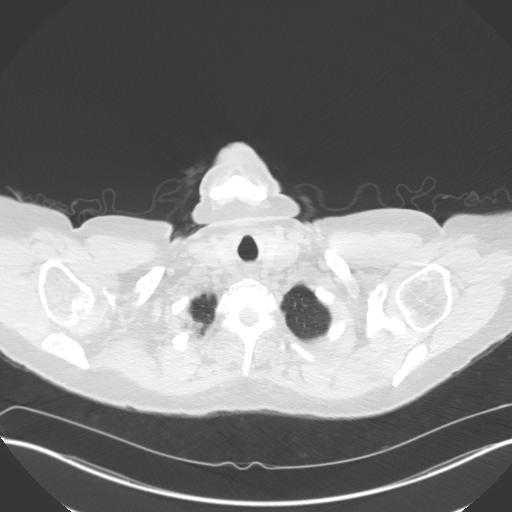

[15 of 34 positions shown; findings below may reference images not displayed]

FINDINGS: Cardiovascular: Prominent right atrium. No significant pericardial
effusion. The thoracic aorta is normal in caliber. No
atherosclerotic plaque of the thoracic aorta. No coronary artery
calcifications.

Mediastinum/Nodes: No gross hilar adenopathy, noting limited
sensitivity for the detection of hilar adenopathy on this
noncontrast study. No enlarged mediastinal or axillary lymph nodes.
Thyroid gland, trachea, and esophagus demonstrate no significant
findings.

Lungs/Pleura: No focal consolidation. No pulmonary nodule. No
pulmonary mass. Persistent bilateral trace pleural effusions. No
pneumothorax.

Upper Abdomen: No acute abnormality.

Musculoskeletal:

No chest wall abnormality.

No suspicious lytic or blastic osseous lesions. No acute displaced
fracture. Multilevel degenerative changes of the spine.
IMPRESSION: 1. Persistent nonspecific trace bilateral pleural effusions.
2. Mild cardiomegaly.
3. No correlation with radiographic finding (x-ray 01/18/2022).

## 2024-06-08 DIAGNOSIS — J0101 Acute recurrent maxillary sinusitis: Secondary | ICD-10-CM | POA: Diagnosis not present

## 2024-06-08 DIAGNOSIS — M7071 Other bursitis of hip, right hip: Secondary | ICD-10-CM | POA: Diagnosis not present

## 2024-07-23 DIAGNOSIS — Z124 Encounter for screening for malignant neoplasm of cervix: Secondary | ICD-10-CM | POA: Diagnosis not present

## 2024-07-23 DIAGNOSIS — M8588 Other specified disorders of bone density and structure, other site: Secondary | ICD-10-CM | POA: Diagnosis not present

## 2024-07-23 DIAGNOSIS — Z1231 Encounter for screening mammogram for malignant neoplasm of breast: Secondary | ICD-10-CM | POA: Diagnosis not present

## 2024-07-23 DIAGNOSIS — N958 Other specified menopausal and perimenopausal disorders: Secondary | ICD-10-CM | POA: Diagnosis not present

## 2024-07-23 DIAGNOSIS — Z6824 Body mass index (BMI) 24.0-24.9, adult: Secondary | ICD-10-CM | POA: Diagnosis not present
